# Patient Record
Sex: Female | Born: 1935 | Race: White | Hispanic: No | Smoking: Never smoker
Health system: Southern US, Community
[De-identification: ages and names within clinical notes are randomized; demographics above are authoritative.]

## PROBLEM LIST (undated history)

## (undated) DIAGNOSIS — F0280 Dementia in other diseases classified elsewhere without behavioral disturbance: Secondary | ICD-10-CM

## (undated) DIAGNOSIS — C50919 Malignant neoplasm of unspecified site of unspecified female breast: Secondary | ICD-10-CM

## (undated) DIAGNOSIS — I4891 Unspecified atrial fibrillation: Secondary | ICD-10-CM

## (undated) DIAGNOSIS — I2510 Atherosclerotic heart disease of native coronary artery without angina pectoris: Secondary | ICD-10-CM

## (undated) DIAGNOSIS — I10 Essential (primary) hypertension: Secondary | ICD-10-CM

## (undated) DIAGNOSIS — N200 Calculus of kidney: Secondary | ICD-10-CM

## (undated) DIAGNOSIS — R32 Unspecified urinary incontinence: Secondary | ICD-10-CM

## (undated) DIAGNOSIS — E119 Type 2 diabetes mellitus without complications: Secondary | ICD-10-CM

## (undated) DIAGNOSIS — G309 Alzheimer's disease, unspecified: Secondary | ICD-10-CM

## (undated) DIAGNOSIS — E785 Hyperlipidemia, unspecified: Secondary | ICD-10-CM

## (undated) HISTORY — PX: REPLACEMENT TOTAL KNEE: SUR1224

## (undated) HISTORY — PX: ABDOMINAL HYSTERECTOMY: SHX81

## (undated) HISTORY — DX: Malignant neoplasm of unspecified site of unspecified female breast: C50.919

## (undated) HISTORY — DX: Calculus of kidney: N20.0

## (undated) HISTORY — DX: Unspecified urinary incontinence: R32

---

## 2003-01-06 ENCOUNTER — Inpatient Hospital Stay (HOSPITAL_COMMUNITY): Admission: RE | Admit: 2003-01-06 | Discharge: 2003-01-11 | Payer: Self-pay | Admitting: Orthopaedic Surgery

## 2003-01-11 ENCOUNTER — Inpatient Hospital Stay
Admission: RE | Admit: 2003-01-11 | Discharge: 2003-01-19 | Payer: Self-pay | Admitting: Physical Medicine & Rehabilitation

## 2003-01-19 ENCOUNTER — Encounter: Admission: RE | Admit: 2003-01-19 | Discharge: 2003-01-19 | Payer: Self-pay | Admitting: Family Medicine

## 2003-08-01 ENCOUNTER — Encounter: Admission: RE | Admit: 2003-08-01 | Discharge: 2003-08-01 | Payer: Self-pay | Admitting: Internal Medicine

## 2003-11-19 ENCOUNTER — Emergency Department (HOSPITAL_COMMUNITY): Admission: EM | Admit: 2003-11-19 | Discharge: 2003-11-19 | Payer: Self-pay | Admitting: Family Medicine

## 2005-01-01 ENCOUNTER — Ambulatory Visit: Payer: Self-pay | Admitting: Internal Medicine

## 2006-07-21 ENCOUNTER — Ambulatory Visit: Payer: Self-pay | Admitting: Internal Medicine

## 2006-07-21 LAB — CONVERTED CEMR LAB
Crystals: NEGATIVE
Mucus, UA: NEGATIVE
Nitrite: NEGATIVE
Total Protein, Urine: 30 mg/dL — AB
pH: 5.5 (ref 5.0–8.0)

## 2006-08-17 ENCOUNTER — Ambulatory Visit: Payer: Self-pay | Admitting: Internal Medicine

## 2006-08-17 LAB — CONVERTED CEMR LAB
ALT: 14 units/L (ref 0–40)
Alkaline Phosphatase: 62 units/L (ref 39–117)
BUN: 24 mg/dL — ABNORMAL HIGH (ref 6–23)
Basophils Relative: 0.5 % (ref 0.0–1.0)
CO2: 31 meq/L (ref 19–32)
Calcium: 9.7 mg/dL (ref 8.4–10.5)
Eosinophils Absolute: 0.2 10*3/uL (ref 0.0–0.6)
GFR calc Af Amer: 78 mL/min
GFR calc non Af Amer: 64 mL/min
HDL: 48.5 mg/dL (ref >39.0)
Lymphocytes Relative: 28.4 % (ref 12.0–46.0)
Monocytes Relative: 7.8 % (ref 3.0–11.0)
Neutro Abs: 3.7 10*3/uL (ref 1.4–7.7)
Platelets: 232 10*3/uL (ref 150–400)
RBC: 4.54 M/uL (ref 3.87–5.11)
Total CHOL/HDL Ratio: 4.1
Triglycerides: 165 mg/dL — ABNORMAL HIGH (ref 0–149)
VLDL: 33 mg/dL (ref 0–40)

## 2007-04-20 ENCOUNTER — Encounter: Admission: RE | Admit: 2007-04-20 | Discharge: 2007-04-20 | Payer: Self-pay | Admitting: Family Medicine

## 2007-07-16 ENCOUNTER — Encounter: Admission: RE | Admit: 2007-07-16 | Discharge: 2007-07-16 | Payer: Self-pay | Admitting: Emergency Medicine

## 2008-07-03 ENCOUNTER — Encounter: Payer: Self-pay | Admitting: Cardiovascular Disease

## 2008-07-14 ENCOUNTER — Encounter: Payer: Self-pay | Admitting: Cardiovascular Disease

## 2008-12-08 ENCOUNTER — Encounter: Payer: Self-pay | Admitting: Cardiovascular Disease

## 2009-01-22 DIAGNOSIS — C50919 Malignant neoplasm of unspecified site of unspecified female breast: Secondary | ICD-10-CM

## 2009-01-22 DIAGNOSIS — E119 Type 2 diabetes mellitus without complications: Secondary | ICD-10-CM

## 2009-01-22 DIAGNOSIS — I10 Essential (primary) hypertension: Secondary | ICD-10-CM | POA: Insufficient documentation

## 2009-01-22 DIAGNOSIS — N200 Calculus of kidney: Secondary | ICD-10-CM | POA: Insufficient documentation

## 2009-01-22 DIAGNOSIS — K219 Gastro-esophageal reflux disease without esophagitis: Secondary | ICD-10-CM | POA: Insufficient documentation

## 2009-01-22 DIAGNOSIS — N390 Urinary tract infection, site not specified: Secondary | ICD-10-CM

## 2009-01-22 HISTORY — DX: Malignant neoplasm of unspecified site of unspecified female breast: C50.919

## 2009-01-22 HISTORY — DX: Calculus of kidney: N20.0

## 2009-01-23 ENCOUNTER — Encounter (INDEPENDENT_AMBULATORY_CARE_PROVIDER_SITE_OTHER): Payer: Self-pay | Admitting: *Deleted

## 2009-01-23 ENCOUNTER — Ambulatory Visit: Payer: Self-pay | Admitting: Cardiovascular Disease

## 2009-01-23 DIAGNOSIS — I4510 Unspecified right bundle-branch block: Secondary | ICD-10-CM

## 2009-01-23 DIAGNOSIS — R079 Chest pain, unspecified: Secondary | ICD-10-CM | POA: Insufficient documentation

## 2009-01-23 DIAGNOSIS — R0602 Shortness of breath: Secondary | ICD-10-CM

## 2009-01-27 IMAGING — US US ABDOMEN COMPLETE
1 series · 14 of 25 positions shown · non-contrast
Comparison: none

CLINICAL DATA: Abdominal pain.

[Series 1: us abdomen complete · 0.26mm/px · 14 of 71 slices shown]
[im 1/71]
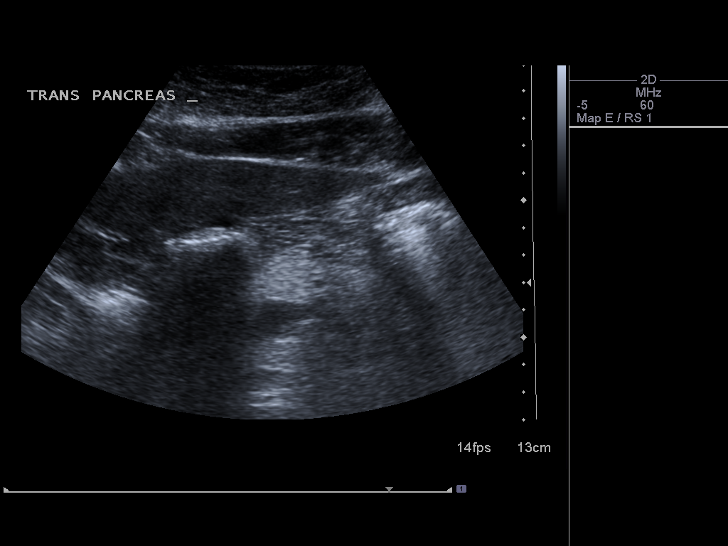
[im 6/71]
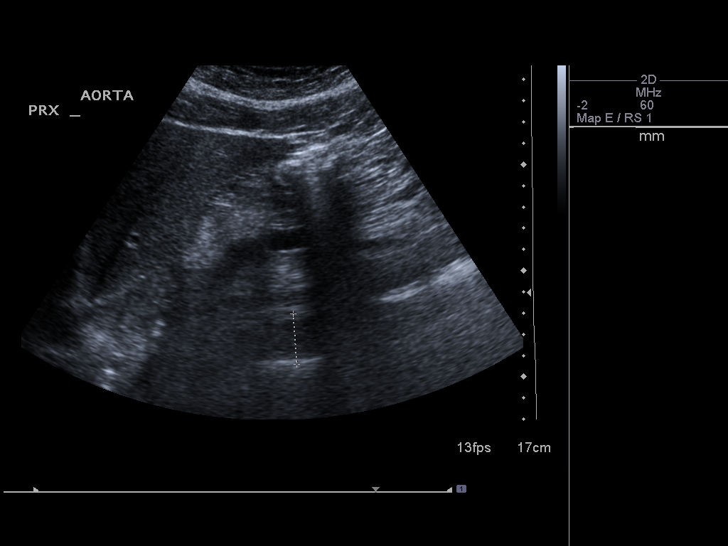
[im 12/71]
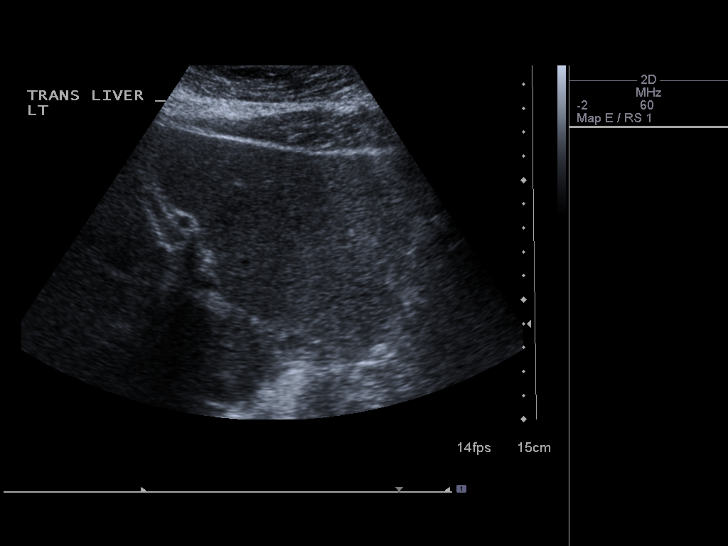
[im 18/71]
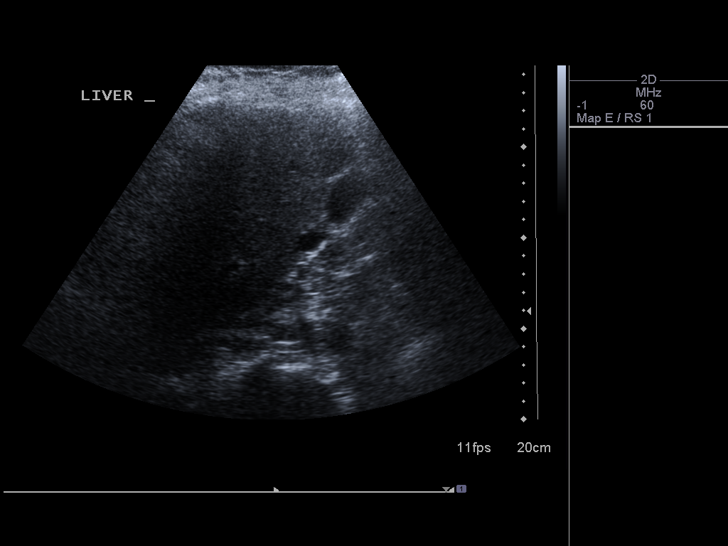
[im 24/71]
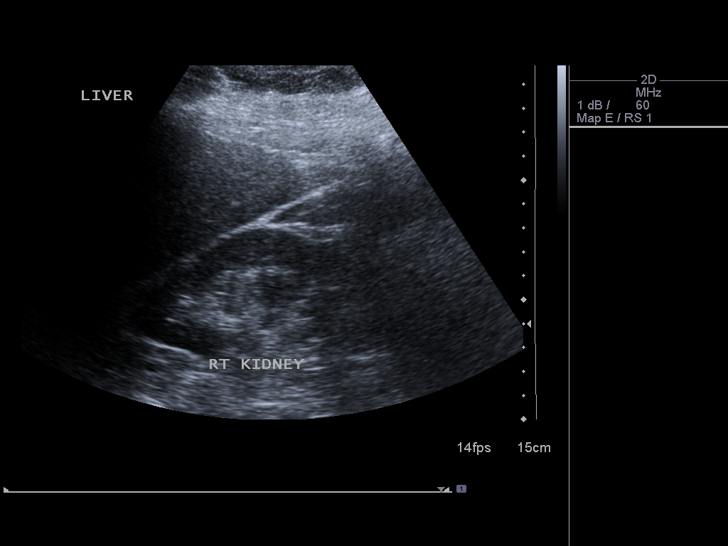
[im 27/71]
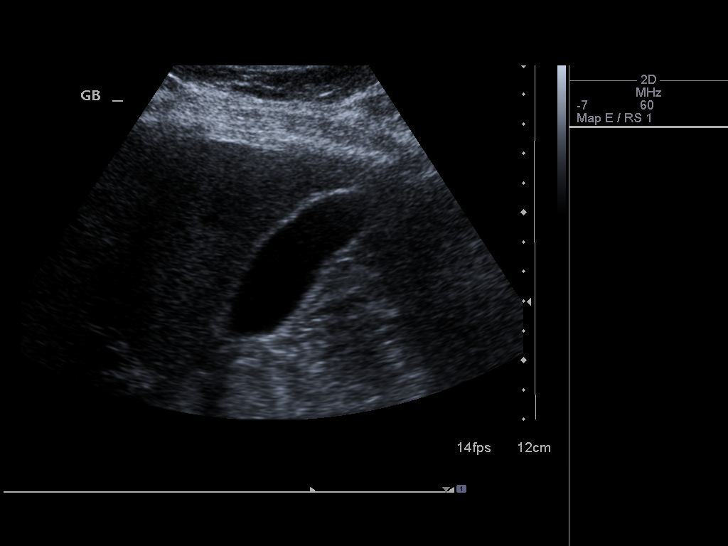
[im 33/71]
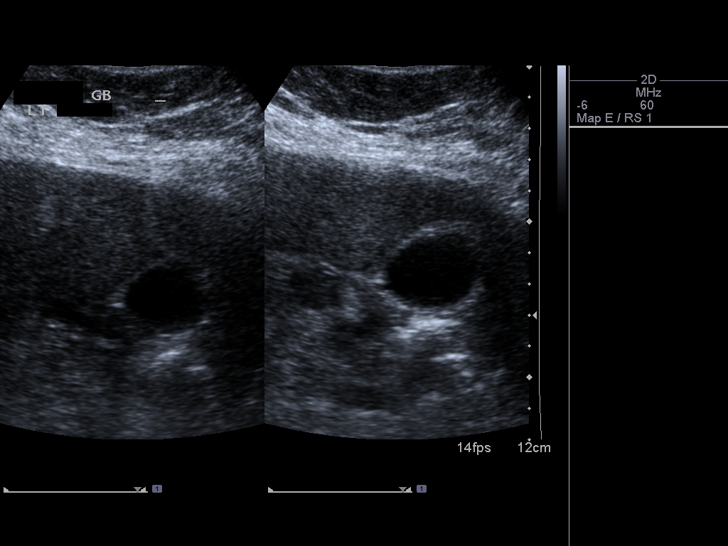
[im 38/71]
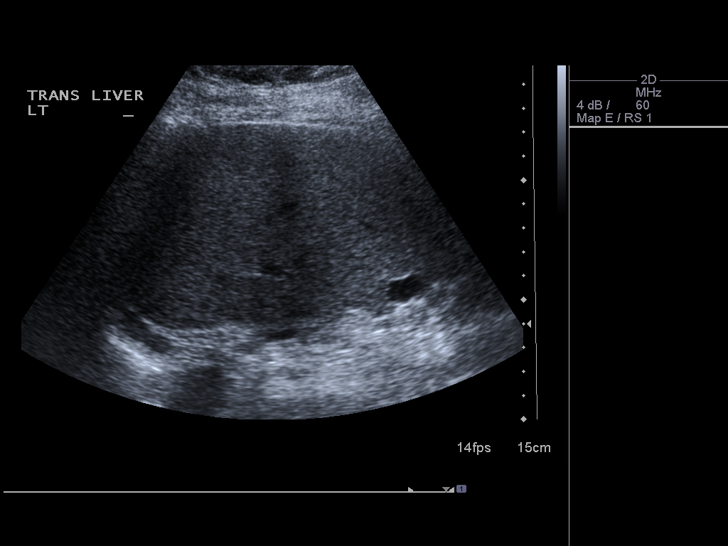
[im 44/71]
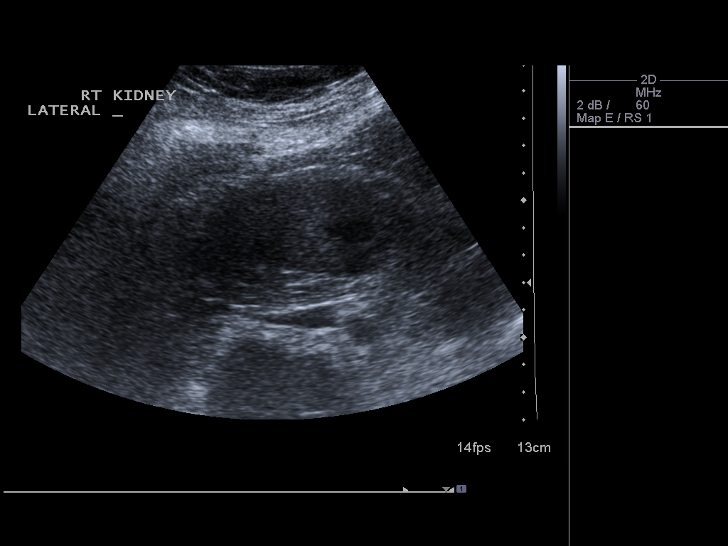
[im 47/71]
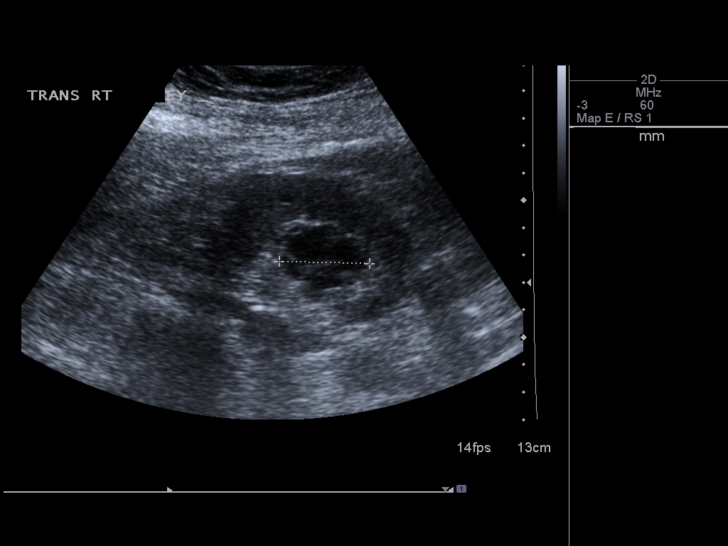
[im 53/71]
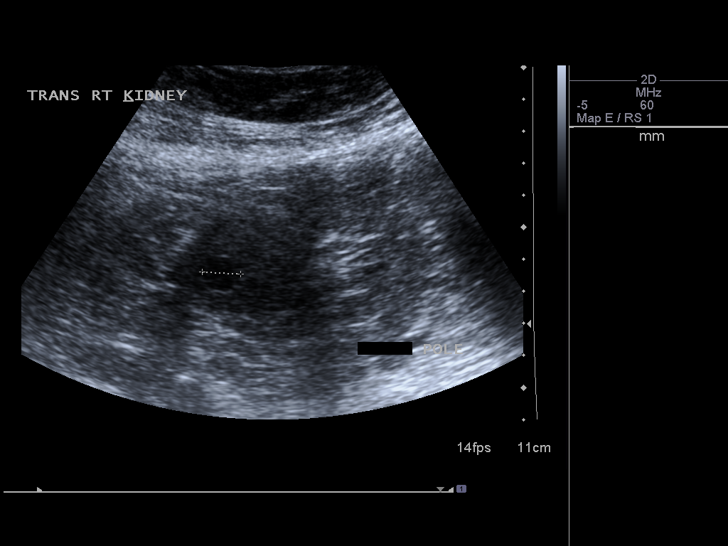
[im 59/71]
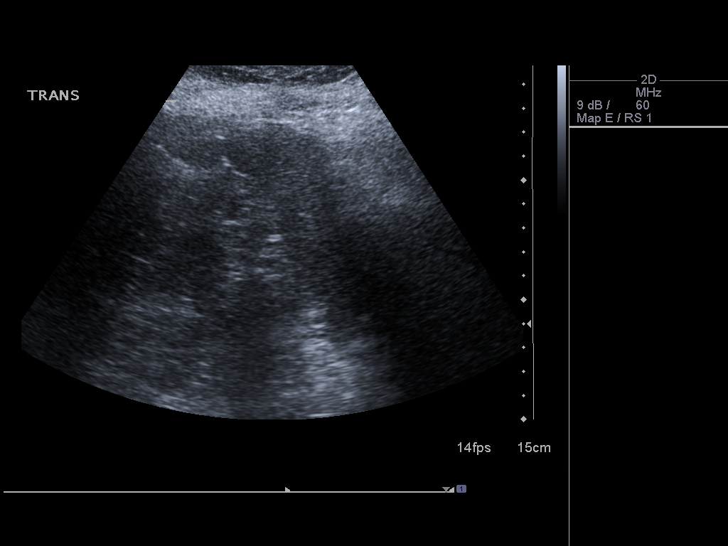
[im 65/71]
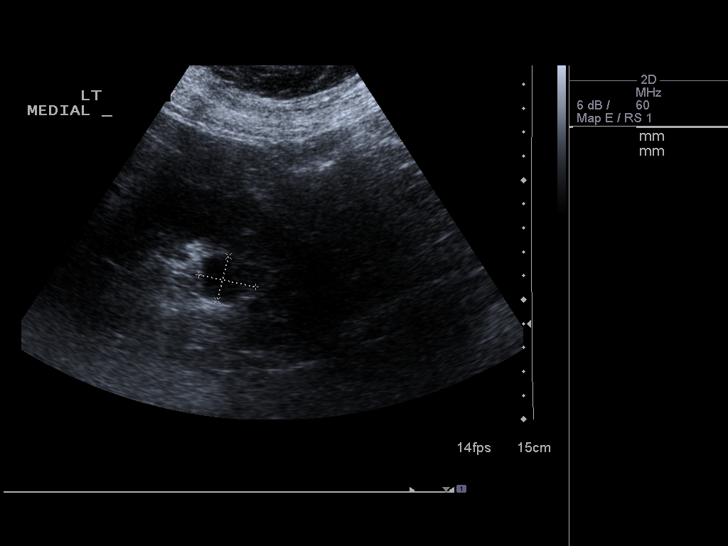
[im 71/71]
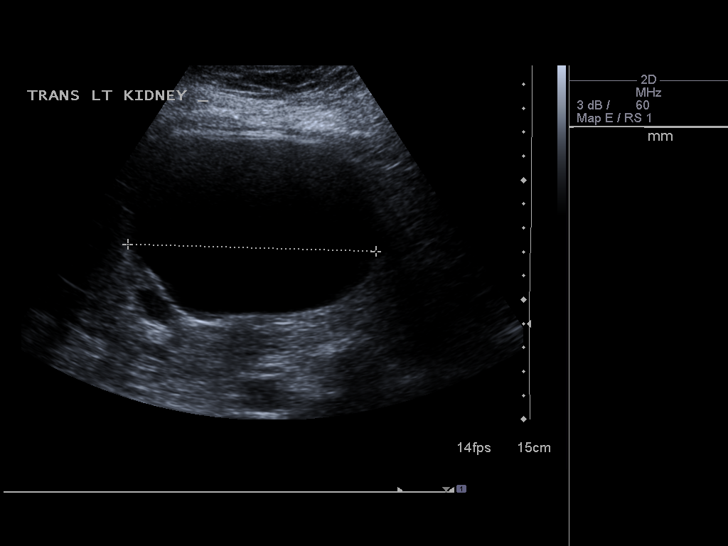

[14 of 25 positions shown; findings below may reference images not displayed]

Abdomen ultrasound:

Comparison to previous CT of 08/01/2003 and earlier studies. Visualized portions
of pancreas, abdominal aorta, IVC unremarkable, segments obscured by overlying
bowel gas. There is a 11 x 16 mm simple cyst in the  anterior right hepatic
segment. Remainder of liver parenchyma unremarkable. Gallbladder physiologically
distended without stones, wall thickening, or pericholecystic fluid. Visualized
portions of common bile duct unremarkable, normal in caliber, 3.9 mm diameter.
The right kidney measures at least 12.6 cm in length, with a septated cystic
parapelvic process in the lower pole measured 25 x 33 x 43 mm. A small separate
13 mm cyst is also noted in the lower pole. The left kidney measures at least
13.1 cm in length, with a 7.8 x 10.2 x 2.5 cm partial exophytic cyst extending
laterally. No hydronephrosis.
IMPRESSION: 1. Normal gallbladder.
2. Complex cystic right renal process, corresponding to lesions seen on recent
CT.  The lack of enlargement compared to CT of 01/07/2003 would suggest a benign
etiology.

## 2009-01-31 ENCOUNTER — Telehealth (INDEPENDENT_AMBULATORY_CARE_PROVIDER_SITE_OTHER): Payer: Self-pay | Admitting: *Deleted

## 2009-02-01 ENCOUNTER — Ambulatory Visit: Payer: Self-pay

## 2009-02-01 ENCOUNTER — Encounter: Payer: Self-pay | Admitting: Cardiovascular Disease

## 2010-04-18 ENCOUNTER — Encounter: Payer: Self-pay | Admitting: Family Medicine

## 2010-04-18 ENCOUNTER — Ambulatory Visit: Payer: Self-pay | Admitting: Family Medicine

## 2010-04-18 DIAGNOSIS — R32 Unspecified urinary incontinence: Secondary | ICD-10-CM

## 2010-04-18 DIAGNOSIS — R300 Dysuria: Secondary | ICD-10-CM | POA: Insufficient documentation

## 2010-04-18 DIAGNOSIS — H5316 Psychophysical visual disturbances: Secondary | ICD-10-CM

## 2010-04-18 DIAGNOSIS — R413 Other amnesia: Secondary | ICD-10-CM

## 2010-04-18 HISTORY — DX: Unspecified urinary incontinence: R32

## 2010-04-18 LAB — CONVERTED CEMR LAB
Albumin: 4.3 g/dL (ref 3.5–5.2)
Alkaline Phosphatase: 74 units/L (ref 39–117)
BUN: 24 mg/dL — ABNORMAL HIGH (ref 6–23)
Bilirubin Urine: NEGATIVE
Glucose, Bld: 138 mg/dL — ABNORMAL HIGH (ref 70–99)
Glucose, Urine, Semiquant: NEGATIVE
Hemoglobin: 12.9 g/dL (ref 12.0–15.0)
MCHC: 31.5 g/dL (ref 30.0–36.0)
MCV: 96.5 fL (ref 78.0–100.0)
Protein, U semiquant: NEGATIVE
RBC: 4.25 M/uL (ref 3.87–5.11)
Total Bilirubin: 0.6 mg/dL (ref 0.3–1.2)
pH: 5.5

## 2010-04-29 ENCOUNTER — Encounter: Payer: Self-pay | Admitting: Family Medicine

## 2010-05-10 ENCOUNTER — Encounter: Payer: Self-pay | Admitting: Family Medicine

## 2010-07-14 ENCOUNTER — Encounter: Payer: Self-pay | Admitting: *Deleted

## 2010-07-23 NOTE — Assessment & Plan Note (Signed)
Summary: NP/KH   Vital Signs:  Patient profile:   77 year old female Height:      60 inches Weight:      242.8 pounds BMI:     47.59 Pulse rate:   72 / minute BP sitting:   126 / 61  (right arm)  Vitals Entered By: Arlyss Repress CMA, (April 18, 2010 9:00 AM) CC: new pt. establish care with new pcp. c/o dysurian and frequency x several weeks. Is Patient Diabetic? Yes Pain Assessment Patient in pain? no        CC:  new pt. establish care with new pcp. c/o dysurian and frequency x several weeks.Marland Kitchen  History of Present Illness: 77 y/o F with pmhx of CAD, DM, HTN, HLD is here to establish care. She is acccompanied by her daughter, Jory Sims.  Daugh works for [employer] and was referred here by Dr Lavada Mesi.  The history was given by both the patient and her daughter.   Pt resides at [facility], which is an independent residence.  Pt has her own apt there.  She lives on the 2nd floor and only takes the elevator.  She can walk with the use of her rolling walker, but she has knee pain (s/p knee arthroplasty), which limits her walking.  She can bathe herself and prepare her own meal.  She does not take care of her finances any longer.    Dysuria:  Pt complains of LLQ pain with urination.  She has a history of UTI, and per daughter gets UTI every other month. She is on chronic macrobid for prophylaxis.  Denies fever, chills, abd pain, back pain, urinary retention.    For the past 2-3 months Daugher is noticing that pt has been more "nervous" and paranoid about her checks being deposited.  Her checks are directly deposited but pt forgets this. She keeps asking her daughter to take her to the bank to deposit her checks.  She also says that her daughter is not coming by to visit with her, even though she comes by everyday. She is becoming more forgetful.    Over the past week or so patient has been complaining to daughter that she sees someone outside her front door, looking  into her apartment.  She describes an incident where the complex had to fix her door.  They left a gaping hole at the bottom of the door.  Since then she has been seeing people looking into her apt.  She has not heard these people speak.  At one time she went to the door and stuck a knife through the opening/hole.  Her intention was to "poke their eye out."  Patient and her daughter have spoken to management about this and they insist that there is noone looking into her apt.     *** Preventative MMG: 2011 Tetanus: Dr Marcos Eke    ***Was a pt of Dr Marcos Eke, at [facility]  Habits & Providers  Alcohol-Tobacco-Diet     Tobacco Status: current     Tobacco Counseling: to quit use of tobacco products     Other Tobacco snuff  Current Medications (verified): 1)  Atenolol 25 Mg Tabs (Atenolol) .... Take One Tablet By Mouth Daily 2)  Glucotrol 10 Mg Tabs (Glipizide) .Marland Kitchen.. 1 Tab By Mouth Once Daily 3)  Aspirin 81 Mg Tbec (Aspirin) .Marland Kitchen.. 1 Tab By Mouth Daily 4)  Simvastatin 40 Mg Tabs (Simvastatin) .... Take One Tablet By Mouth Daily At Bedtime 5)  Omeprazole 20 Mg Tbec (Omeprazole) .... Take 1 Tablet By Mouth Once A Day \\par  6)  Oxybutynin Chloride 5 Mg Tabs (Oxybutynin Chloride) 7)  Nitrofurantoin Macrocrystal 100 Mg Caps (Nitrofurantoin Macrocrystal) .Marland Kitchen.. 1 Tab By Mouth Daily 8)  Diclofenac Sodium 75 Mg Tbec (Diclofenac Sodium) .Marland Kitchen.. 1 Tab By Mouth Two Times A Day 9)  Centrum Silver  Tabs (Multiple Vitamins-Minerals) .Marland Kitchen.. 1 Tab By Mouth Daily 10)  Fish Oil 1000 Mg Caps (Omega-3 Fatty Acids) .... Two Times A Day  Allergies (verified): 1)  ! Sulfa 2)  ! Penicillin  Past History:  Family History: Last updated: 04/18/2010 Breast cancer: mother, sister Diabetes: parents CVA: parents  Social History: Last updated: 04/18/2010 Widow.  Resides in [place], an independent living facility.  She has an apt on the 2nd floor.  She takes the elevator.  Education: 8th grade  Etoh:  none Tobacoo: Snuff x 77 yrs Drugs: none  Risk Factors: Smoking Status: current (04/18/2010)  Past Medical History: Current Problems:  HYPERTENSION (ICD-401.9) GERD (ICD-530.81) DIABETES MELLITUS (ICD-250.00) UTI (ICD-599.0) RENAL CALCULUS (ICD-592.0) BREAST CANCER (ICD-174.9) Urinary incontinence  History of Breast Lump, benign (L) History of nephrolithiasis CAD with neg Myoview  01/2009  Past Surgical History: Status post right total knee arthroplasty 06 January 2003. Lumpectomy, 2000.  Hysterectomy. Tonsillectomy. Excision of skin cancers. R Cataract removal   Family History: Breast cancer: mother, sister Diabetes: parents CVA: parents  Social History: Widow.  Resides in [place], an independent living facility.  She has an apt on the 2nd floor.  She takes the elevator.  Education: 8th grade  Etoh: none Tobacoo: Snuff x 77 yrs Drugs: noneSmoking Status:  current  Review of Systems General:  Denies chills, fever, weakness, and weight loss. Eyes:  Denies blurring, discharge, eye irritation, eye pain, and itching. ENT:  Complains of decreased hearing; denies difficulty swallowing, nasal congestion, and ringing in ears. CV:  Denies chest pain or discomfort, fainting, near fainting, and palpitations. Resp:  Denies chest discomfort, chest pain with inspiration, cough, and sputum productive. GI:  Denies constipation, diarrhea, nausea, and vomiting. GU:  Complains of dysuria, incontinence, and urinary frequency; denies abnormal vaginal bleeding. MS:  Complains of joint pain and mid back pain.  Physical Exam  General:  Well-developed,well-nourished,in no acute distress; alert,appropriate and cooperative throughout examination. Vitals reviewed.  Lungs:  Normal respiratory effort, chest expands symmetrically. Lungs are clear to auscultation, no crackles or wheezes.  Heart:  Distant heart sounds. S1 and S2 normal without gallop, murmur, click, rub or other extra  sounds. Msk:  Get up to and go: normal  Pt able to raise both arms and place them behind her ears with minimal pain. No palpable areas of tenderness.   Seated straight leg raise: no tenderness.  Pt not able to keep leg raised against resistance.    Extremities:  No edema  Neurologic:  alert & oriented X3.   Pt cannot recall what she made for dinner last night.  She tells a story of cooking multiple dishes, one of which is ribs.  Her daughter denies that pt made this last night.   No rigidity Gait nonshuffling   Impression & Recommendations:  Problem # 1:  DYSURIA (ICD-788.1) Assessment New Clean catch UA was negative for nitrites/leuk, +trace intact blood, 1 bacteria, 0-2 RBC.  Likely not UTI.  Will continue on chronic nitrofurantoin for now.  There was talc powder seen in UA.  Pt may be getting powder in vaginal area and this may be causing  irritation when she voids.  Pt has history of DM and yeast infections in skin folds.  Advised d/c use of talc powder and using otc clotrimazole cream instead.    Her updated medication list for this problem includes:    Nitrofurantoin Macrocrystal 100 Mg Caps (Nitrofurantoin macrocrystal) .Marland Kitchen... 1 tab by mouth daily  Orders: Urinalysis-[facility] (00000) Comp Met-[facility] (33295-18841) CBC-[facility] (66063) [facility]- Est  Level 4 (01601)  Problem # 2:  PSYCHOPHYSICAL VISUAL DISTURBANCES (ICD-368.16) Assessment: New  Per daughter's report and pt's story she is having visual hallucinations.  She is very adement in her story telling that there is someone looking through the hole in her door.  Pt is on Oxybutynin, an anticholinergic that may be causing these hallucinations.  We will try her off of this medication for a few weeks and re-evaluate.    Orders: [facility]- Est  Level 4 (09323)  Problem # 3:  MEMORY LOSS (ICD-780.93) Assessment: New  Daughter has noticed more "forgetfullness" in the last few months (2-3).  Pt is taking an anticholinergic currently (oxybutynin).   We would like this to stop at this time.  Would like to see pt in a couple of weeks to assess her memory.  She is endorsing visual hallucinations, which is concerning for Lewy body dementia.  Exam did not show shuffling gait or rigidity that would be concerning for Parkinson's.    Orders: [facility]- Est  Level 4 (55732)  Problem # 4:  DIABETES MELLITUS (ICD-250.00) Assessment: Unchanged Will check A1C.  Pt's last A1C in our system was from 2008.  Pt only on glucotrol 10mg  daily.  Given her age, goal A1C is <8, but if she is well controlled on glucotrol and taking it without problems, then will continue on this dose.    The following medications were removed from the medication list:    Glucophage 500 Mg Tabs (Metformin hcl) .Marland Kitchen... 1 tab by mouth once daily Her updated medication list for this problem includes:    Glucotrol 10 Mg Tabs (Glipizide) .Marland Kitchen... 1 tab by mouth once daily    Aspirin 81 Mg Tbec (Aspirin) .Marland Kitchen... 1 tab by mouth daily  Orders: Comp Met-[facility] (20254-27062) TSH-[facility] (37628-31517) A1C-[facility] (61607)  Complete Medication List: 1)  Atenolol 25 Mg Tabs (Atenolol) .... Take one tablet by mouth daily 2)  Glucotrol 10 Mg Tabs (Glipizide) .Marland Kitchen.. 1 tab by mouth once daily 3)  Aspirin 81 Mg Tbec (Aspirin) .Marland Kitchen.. 1 tab by mouth daily 4)  Simvastatin 40 Mg Tabs (Simvastatin) .... Take one tablet by mouth daily at bedtime 5)  Omeprazole 20 Mg Tbec (Omeprazole) .... Take 1 tablet by mouth once a day \\par  6)  Oxybutynin Chloride 5 Mg Tabs (Oxybutynin chloride) 7)  Nitrofurantoin Macrocrystal 100 Mg Caps (Nitrofurantoin macrocrystal) .Marland Kitchen.. 1 tab by mouth daily 8)  Diclofenac Sodium 75 Mg Tbec (Diclofenac sodium) .Marland Kitchen.. 1 tab by mouth two times a day 9)  Centrum Silver Tabs (Multiple vitamins-minerals) .Marland Kitchen.. 1 tab by mouth daily 10)  Fish Oil 1000 Mg Caps (Omega-3 fatty acids) .... Two times a day  Patient Instructions: 1)  Please schedule a follow-up appointment in [facility]. 2)  It was  very nice meeting today. 3)  Please look taking oxybutynin.   4)      Orders Added: 1)  Urinalysis-[facility] [00000] 2)  Comp Met-[facility] [80053-22900] 3)  CBC-[facility] [85027] 4)  TSH-[facility] [37106-26948] 5)  A1C-[facility] [83036] 6)  [facility]- Est  Level 4 [54627]   Immunization History:  Influenza Immunization History:    Influenza:  historical (03/27/2010)   Immunization History:  Influenza Immunization History:    Influenza:  Historical (03/27/2010)  Laboratory Results   Urine Tests  Date/Time Received: April 18, 2010 9:48 AM  Date/Time Reported: April 18, 2010 10:10 AM   Routine Urinalysis   Color: yellow Appearance: Clear Glucose: negative   (Normal Range: Negative) Bilirubin: negative   (Normal Range: Negative) Ketone: negative   (Normal Range: Negative) Spec. Gravity: 1.020   (Normal Range: 1.003-1.035) Blood: trace-intact   (Normal Range: Negative) pH: 5.5   (Normal Range: 5.0-8.0) Protein: negative   (Normal Range: Negative) Urobilinogen: 0.2   (Normal Range: 0-1) Nitrite: negative   (Normal Range: Negative) Leukocyte Esterace: negative   (Normal Range: Negative)  Urine Microscopic WBC/HPF: rare RBC/HPF: 0-2 Bacteria/HPF: 1+ Epithelial/HPF: 5-10 Crystals/HPF: mod talc crystals    Comments: ...........test performed by...........Marland KitchenTerese Door, CMA   Blood Tests   Date/Time Received: April 18, 2010 10:40 AM  Date/Time Reported: April 18, 2010 11:10 AM   HGBA1C: 6.3%   (Normal Range: Non-Diabetic - 3-6%   Control Diabetic - 6-8%)  Comments: ...............test performed by......Marland KitchenBonnie A. [place]az[place], MLS (ASCP)cm      Prevention & Chronic Care Immunizations   Influenza vaccine: Historical  (03/27/2010)    Tetanus booster: Not documented    Pneumococcal vaccine: Not documented    H. zoster vaccine: Not documented  Colorectal Screening   Hemoccult: Not documented    Colonoscopy: Not documented  Other Screening   Pap smear: Not documented   Pap  smear action/deferral: Not indicated-other  (04/18/2010)    Mammogram: Not documented    DXA bone density scan: Not documented   Smoking status: current  (04/18/2010)  Diabetes Mellitus   HgbA1C: 6.3  (04/18/2010)    Eye exam: Not documented    Foot exam: Not documented   High risk foot: Not documented   Foot care education: Not documented    Urine microalbumin/creatinine ratio: Not documented  Lipids   Total Cholesterol: 198  (08/17/2006)   LDL: 117  (08/17/2006)   LDL Direct: Not documented   HDL: 48.5  (08/17/2006)   Triglycerides: 165  (08/17/2006)  Hypertension   Last Blood Pressure: 126 / 61  (04/18/2010)   Serum creatinine: 0.9  (08/17/2006)   Serum potassium 4.0  (08/17/2006) CMP ordered   Self-Management Support :    Diabetes self-management support: Not documented    Hypertension self-management support: Not documented    Appended Document: NP/KH    Physical Exam  Additional Exam:  GERIATRIC FUNCTION SCREEN Mental status: registration +3, recall +3 Hearing problem: positive, was told she needs a hearing aid, but pt refuses due to cost Vision: R cataract, wears glasses, can watch TV and get around home with her glasses Home environment: Cannot walk up/down stairs, but does not need to because her apt complex has elevator.  NO falls. ADL-IADL: Can get out of bed, dresses self, prepares meals.  Does Not shop. Social support: daughters (3).  Her apt has an emergency call line Nutrition: no weight loss, +wt gain Urinary incontinence: positive, wear Depends for bed Depression: situational; 1 yr anniversary of daughter's death during Thanksgiving Arm: can touch arms to back of head.  Can pick up pen. Leg: can rise from chair (get up and to), but cannot walk very far due to painful knee Health self report: Poor   Appended Document: NP/KH I evaluated Mrs Arnall and discussed her care with Dr T.

## 2010-07-23 NOTE — Miscellaneous (Signed)
Summary: Records from [facility]  Clinical Lists Changes     Received records from [facility].  Review of records showed just office visit notes. NO LABS, NO Vaccination records.  I will not be scanning or adding any additional records from [facility].

## 2010-07-23 NOTE — Miscellaneous (Signed)
Summary: ROI  ROI   Imported By: Bradly Bienenstock 05/10/2010 11:53:37  _____________________________________________________________________  External Attachment:    Type:   Image     Comment:   External Document

## 2010-11-08 NOTE — Op Note (Signed)
NAMEMALILLANY, KAZLAUSKAS NO.:  [id number]   MEDICAL RECORD NO.:  [id number]                   PATIENT TYPE:  INP   LOCATION:  2899                                 FACILITY:  [facility]   PHYSICIAN:  Mark C. Ophelia Charter, M.D.                 DATE OF BIRTH:  1924-09-07   DATE OF PROCEDURE:  01/06/2003  DATE OF DISCHARGE:                                 OPERATIVE REPORT   PREOPERATIVE DIAGNOSIS:  Right knee osteoarthritis.   POSTOPERATIVE DIAGNOSIS:  Right knee osteoarthritis.   PROCEDURE:  Right total knee arthroplasty.   SURGEON:  Mark C. Ophelia Charter, M.D.   ASSISTANT:  Genene Churn. Denton Meek.   ANESTHESIA:  GOT, postop femoral nerve block.   OPERATIVE PROCEDURE:  After induction of general anesthesia, orotracheal  intubation, a proximal thigh tourniquet with application of preoperative  antibiotics given, which was Ancef, the leg was prepped with Duraprep, the  usual extremity sheets, drapes, split U-sheets, impervious stockinette,  Coban, sterile skin marker, and Betadine Vi-Drape application was applied.  The leg was wrapped with an Esmarch prior to tourniquet inflation.  A  midline incision was made, superficial retinaculum was reflected, and a  median parapatellar incision was performed, splitting the quad tendon  between the medial one-third and lateral two-thirds.   The patella was cut first using the oscillating saw, going from facet to  facet.  There was grade 4 chondromalacia with erosive changes in the  patella.  Intramedullary hole was made in the femur, rod placed up, and the  distal cut was made based on the #5 sizing.  Intramedullary AP cut was made,  intramedullary rod was placed on the tibia, and guide was inserted and cut  was made on the tibia, removing 10 mm of bone.  Next chamfer cuts were made  on the femur, notch cut.  A #5 size was appropriate, although the die just  brushed the anterior cortex, so it was pinned at 7 and cut at 5.  Some  posterior spurs were removed off the femur using a three-quarter curved  osteotome.  There were severe grade 4 changes in the medial compartment.  The lateral compartment was relatively spared with grade 3 and some small  areas of grade 4 changes.  The lateral meniscus showed minimal changes.  The  medial meniscus had been debrided and was degenerative.  ACL and PCL were  sacrificed.  Box cut was used in the femur, and then tibial rotation was set  using trials, flexing and extending the knee.  Rotation was marked with the  Bovie on the anterior cortex of the tibial bone and a keel cut were made,  punching for a cemented #5, which was the appropriate size.  Holes were  drilled in the patella, which was also #5.  Trial sizers were used and a 12  gave the appropriate excellent stability.  The  patient had a lateral  collateral ligament injury from an acute fall when she was in [place], and  this had been treated with a knee immobilizer for six weeks preoperatively,  and the lateral collateral ligament showed scar tissue present in the  midportion of the lateral collateral ligament, and there was chronic change  of synovitis inside the knee with blood deposits that were present from the  ligamentous injury.  Partial synovectomy was performed.  Patellar tracking  was normal, and no lateral release was necessary.  Pulsatile lavage was used  to freshen the bone.  It was meticulously dried.  Cement was vacuum-mixed  and then impacted, placing on both the prosthesis and the bone, tibial  cemented first, followed by the femur, then the tibial tray was popped in  with meticulous drying.  The patella was placed and had excellent fit down  on bone after the three little pegs had been shortened slightly due to the  patient's very small bone size and thin patella.  All excessive cement was  removed.  Cement was hardened 15 minutes.  Repeat irrigation was performed.  There was excellent patellar tracking and  good stability in both flexion and  extension.  She had a few millimeters of opening on the medial and lateral  joint line with the knee in both slight flexion as well as 90 degrees  flexion.  The tourniquet was deflated, small bleeders controlled with the  electrocautery.  Tycron 0 then placed in the quad tendon and deep  retinaculum.  Vicryl sutures were placed in the superficial retinaculum,  subcutaneous tissues, skin staple closure, Marcaine infiltration of the  skin, some in the knee joint, a postop dressing with a knee immobilizer, and  a femoral nerve block.  Instrument count and needle count were correct.  The  patient was transferred to the recovery room in stable condition.                                               Mark C. Ophelia Charter, M.D.    MCY/MEDQ  D:  01/06/2003  T:  01/07/2003  Job:  [id number]

## 2010-11-08 NOTE — Discharge Summary (Signed)
Debbie Rodriguez, ROSANDER                            ACCOUNT NO.:  [id number]   MEDICAL RECORD NO.:  [id number]                   PATIENT TYPE:  INP   LOCATION:  5040                                 FACILITY:  [facility]   PHYSICIAN:  Mark C. Ophelia Charter, M.D.                 DATE OF BIRTH:  1924-07-18   DATE OF ADMISSION:  01/06/2003  DATE OF DISCHARGE:  01/11/2003                                 DISCHARGE SUMMARY   FINAL DIAGNOSES:  1. Status post right total knee arthroplasty 06 January 2003.  2. Hypertension not otherwise specified.  3. Urinary tract infection not otherwise specified.  4. Non-insulin-dependent diabetes mellitus.  5. Long-term use of anticoagulants.   HISTORY OF PRESENT ILLNESS:  A 77 year old white female, with a long history  of right knee osteoarthritis and chronic pain, presented for preop  evaluation of her right total knee arthroplasty.  She had progressively  worsening symptoms with no response to conservative treatment. Significant  decrease in her daily activities due to the ongoing complaint.  Preop x-rays  show tricompartmental degenerative changes and marginal osteophyte  formation.  MRI scan again showed tricompartmental degenerative changes with  partial ACL tear and degenerative meniscus.   LABORATORY DATA BEFORE ADMISSION:  WBC 8.2, RBC 4.79, hemoglobin 14.9,  hematocrit 43.6.  PT 13.6, INR 1.1, PTT 27.  Sodium 138, potassium 4.7,  chloride 105, CO2 29, glucose 257, BUN 13, creatinine 1.0, calcium 9.5,  total protein 6.3, albumin 3.1, AST 27, ALT 22, alkaline phosphatase 114,  total bilirubin 0.5.  UA showed trace hemoglobin, positive nitrites, and  small leukocyte esterase, 11 to 20 white blood cells, 250 glucose.   HOSPITAL COURSE:  On January 06, 2003, the patient was taken to the operating  room at [facility], and a right total knee arthroplasty procedure  was performed.  Surgeon: Veverly Fells. Ophelia Charter, M.D.  Assistant: Genene Churn. Barry Dienes,  P.A.-C.  Anesthesia:  General with femoral nerve block.  The patient was  given local Marcaine 13.5 ml postoperatively.  Estimated blood loss: Less  than 400 ml.  There were no surgical or anesthesia complications, and the  patient was transferred to recovery in stable condition.   After surgical procedure, medicine consult was called due to patient's  increased blood glucose levels.  On January 07, 2003, vital signs stable,  afebrile.  Hemoglobin 12.6.  CBG 290.  Sliding scale changed to insulin-  resistant protocol.   On January 08, 2003, vital signs stable, afebrile.  Hemoglobin 11.6.  Hemoglobin A1C 9.1.  IV and PCA discontinued.  CBG 197, and Lantus was  increased to 20 units q.a.m.  INR 1.1.   On January 09, 2003, good pain control.  Temperature 98.6, blood pressure  146/86, pulse 101, respirations 18.  Uric acid 4.9.  PT 14.2, hemoglobin  11.6.  Sodium 134, potassium 3.6, chloride 99, CO2 28, BUN 15, creatinine  0.8, glucose 141.  Staples intact.  Wound healing nicely.   On January 10, 2003, good pain control.  Vital signs stable, afebrile.  PT  15.4, INR 1.3.  Hemoglobin 11.4.  Serum glucose 131.  CBGs ranged 150 to  200.  The patient had been started on Coumadin for DVT prophylaxis the day  after surgery.  Patient evaluated by diabetes coordinator.   On January 11, 2003, good pain control.  The patient was slow with ambulation.  CPM 0 to 75 degrees.  Vital signs stable, afebrile.  CBG 170.  PT 14.7, INR  1.2.  Hemoglobin 11.5.  Serum glucose 144.  Urine culture negative.  The  patient was doing well and ready for transfer to [facility].   DISPOSITION:  Discharged to [facility].   DISCHARGE MEDICATIONS:  The patient will remain on all current medications.   CONDITION ON DISCHARGE:  Good and stable.   DISCHARGE INSTRUCTIONS:  1. The patient will continue to work with physical therapy to improve     ambulation, range of motion, and strengthening.  2. She will be weightbearing as tolerated with walker.  3. Remain on  Coumadin for DVT prophylaxis x 3 to 4 weeks postoperatively.  4. Staples to be removed two weeks postop.  5. Dressing changes p.r.n.  6. She will follow up in our office one to two weeks after discharge from     [facility].  If there are any problems or complications before that time, we     can be notified at [phone].      Genene Churn. Denton Meek.                      Mark C. Ophelia Charter, M.D.    JMO/MEDQ  D:  02/13/2003  T:  02/13/2003  Job:  [id number]

## 2010-11-08 NOTE — Discharge Summary (Signed)
Debbie Rodriguez, Debbie Rodriguez NO.:  [id number]   MEDICAL RECORD NO.:  [id number]                   PATIENT TYPE:  ORB   LOCATION:                                       FACILITY:  [facility]   PHYSICIAN:  Erick Colace, M.D.           DATE OF BIRTH:  1925/05/21   DATE OF ADMISSION:  01/11/2003  DATE OF DISCHARGE:  01/19/2003                                 DISCHARGE SUMMARY   DISCHARGE DIAGNOSES:  1. Right total knee replacement.  2. Diabetes mellitus type 2, new diagnosis.  3. Hypertension.  4. Gastroesophageal reflux disease.   HISTORY AND PHYSICAL:  Debbie Rodriguez is a 77 year old female with history of  hypertension, breast cancer,  right knee osteoarthritis who elected to  undergo right total knee replacement January 06, 2003, by Dr. Ophelia Charter.  Postoperatively, she is partial weightbearing and on Coumadin for DVT  prophylaxis.  She was noted to have elevated blood sugars and hemoglobin A1C  was checked and noted to be high at 9.1.  CT of the abdomen and pelvis was  done secondary to recent weight loss and this showed no metastatic disease,  decreased fatty infiltration of liver.  Primary care has been following for  medical issues.  On physical therapy initially, the patient is +2 total  assist, 80% for transfers, minimal to guard assist to ambulate 12 feet with  standard walker and with impulsivity noted.   ALLERGIES:  1. SULFA.  2. PENICILLIN.  3. IODINE.   PAST MEDICAL HISTORY:  1. Hypertension.  2. GERD.  3. Left breast cancer, status post lumpectomy.  4. Left knee scope.  5. Hysterectomy.  6. Tonsillectomy.  7. Renal calculi.  8. Excision of skin cancers.   SOCIAL HISTORY:  The patient has been living with daughter and was  wheelchair bound six days prior to admission.  She lives in a one level home  with a ramp at entry.  She does not use any tobacco or alcohol.   HOSPITAL COURSE:  Debbie Rodriguez was admitted to [facility] on January 11, 2003, for  therapies to consist of PT and OT daily.  Past admission, she was maintained  on partial weightbearing status right lower extremity with CPM being used  for passive range of motion.  Coumadin was continued with Lovenox until INR  at therapeutic basis.  Her INR levels had been slow to rise.   Labs done past admission have revealed hemoglobin 11.6, hematocrit 33.8,  white count 6.6, platelets 208.  Sodium 138, potassium 3.5, chloride 101,  CO2 29, BUN 14, creatinine 0.9, glucose 101.  LFTs within normal limits  except for total protein of 5.1, albumin 2.3.  UA/UCS was done on January 11, 2003, and January 14, 2003, both showing multi-species.  The patient's knee  incision has healed well without any signs or symptoms of infection or  drainage.  No erythema noted.  She is noted to have some edema at incision  site.  Staples were discontinued and area steri-stripped on postoperative  day #12 without difficulty.  The patient's pain control has been reasonable.  She does continue with weak quads requiring immobilizers when ambulated.  Knee flexion is currently limited to 58 degrees.   During her stay in [facility], Debbie Rodriguez progressed to being supervision for  transfers, supervision for ambulating 65 feet x2 in a controlled  environment.  She is minimal assist for car transfers.  The patient requires  set up and supervision for upper body care, minimal assist for lower body  care.  A 24-hour supervision is recommended past discharge and family is  unable to provide this, therefore, search for SNF was initiated.  Bed is  available at [facility] on January 19, 2003, and the patient is to be discharged to  this facility with progressive PT and OT to continue past discharge.  CPM is  to continue for passive range of motion to help assist and improve the  patient's range of motion.   Of note, the patient's blood sugars have been monitored on a.c. and h.s.  basis and have shown good control.  Her Glucotrol was  changed to Glucotrol  XL and increased to 10 mg b.i.d.  Blood sugars ranging from 140s to  occasional high in 170s.  The patient has been taken off Lantus insulin as  of January 18, 2003, and Glucophage 500 mg p.o. q.a.m. has been added.  Recommendations are to continue monitoring blood sugars at a.c. and h.s.  basis and to continue titrating Glucophage for tighter blood sugar control.   The patient's Coumadin is therapeutic on the day of discharge and the  patient is to be discharged on 12.5 mg Coumadin.  Next protime is to be  checked on January 22, 2003, with Coumadin dose to be adjusted as needed.   DISCHARGE MEDICATIONS:  1. Tenormin 25 mg per day.  2. Prilosec 20 mg a day.  3. MiraLax 17 g a day.  4. Coumadin 12.5 mg a day.  5. Multivitamin one a day.  6. Glucotrol XL 10 mg b.i.d.  7. Ditropan 2.5 mg q.h.s.  8. Glucophage 500 mg q.a.m.  9. Oxycodone IR 5 to 10 mg q.4-6h. p.r.n. pain.  10.      Skelaxin 400 mg p.o. q.i.d. p.r.n. spasms.   ACTIVITY:  Continue partial weightbearing on right lower extremity with use  of walker.   DIET:  Carb modified medium.   DISCHARGE INSTRUCTIONS:  Progressive PT and OT to continue past discharge.  Continue monitoring blood sugars at a.c. and h.s. basis.  Continue titrating  Glucophage for tighter blood sugar control.  The next protime is to be  checked on January 22, 2003, and Coumadin to continue through February 06, 2003,  to continue DVT prophylaxis close follow-up.    FOLLOW UP:  The patient is to follow up with Dr. Ophelia Charter for postoperative  check, to follow up with local M.D. for diabetic monitoring, to follow up  with Dr. Wynn Banker as needed.      Greg Cutter, P.A.                    Erick Colace, M.D.    PP/MEDQ  D:  01/19/2003  T:  01/19/2003  Job:  [id number]   cc:   Rollene Rotunda, M.D.   Veverly Fells. Ophelia Charter, M.D.  [address]  [place], [place] [address]  Fax: [phone]

## 2012-07-12 ENCOUNTER — Emergency Department (HOSPITAL_COMMUNITY): Payer: Medicare Other

## 2012-07-12 ENCOUNTER — Encounter (HOSPITAL_COMMUNITY): Payer: Self-pay | Admitting: Family Medicine

## 2012-07-12 ENCOUNTER — Observation Stay (HOSPITAL_COMMUNITY)
Admission: EM | Admit: 2012-07-12 | Discharge: 2012-07-15 | Disposition: A | Discharge status: Skilled Nursing Facility | Payer: Medicare Other | Attending: Family Medicine | Admitting: Family Medicine

## 2012-07-12 DIAGNOSIS — R269 Unspecified abnormalities of gait and mobility: Secondary | ICD-10-CM

## 2012-07-12 DIAGNOSIS — R4182 Altered mental status, unspecified: Secondary | ICD-10-CM

## 2012-07-12 DIAGNOSIS — Z79899 Other long term (current) drug therapy: Secondary | ICD-10-CM | POA: Insufficient documentation

## 2012-07-12 DIAGNOSIS — R630 Anorexia: Secondary | ICD-10-CM | POA: Insufficient documentation

## 2012-07-12 DIAGNOSIS — M47812 Spondylosis without myelopathy or radiculopathy, cervical region: Secondary | ICD-10-CM | POA: Insufficient documentation

## 2012-07-12 DIAGNOSIS — R443 Hallucinations, unspecified: Secondary | ICD-10-CM | POA: Insufficient documentation

## 2012-07-12 DIAGNOSIS — I10 Essential (primary) hypertension: Secondary | ICD-10-CM | POA: Insufficient documentation

## 2012-07-12 DIAGNOSIS — I4891 Unspecified atrial fibrillation: Secondary | ICD-10-CM | POA: Insufficient documentation

## 2012-07-12 DIAGNOSIS — F0280 Dementia in other diseases classified elsewhere without behavioral disturbance: Principal | ICD-10-CM | POA: Insufficient documentation

## 2012-07-12 DIAGNOSIS — F0390 Unspecified dementia without behavioral disturbance: Secondary | ICD-10-CM

## 2012-07-12 DIAGNOSIS — I4510 Unspecified right bundle-branch block: Secondary | ICD-10-CM | POA: Insufficient documentation

## 2012-07-12 DIAGNOSIS — R5383 Other fatigue: Secondary | ICD-10-CM

## 2012-07-12 DIAGNOSIS — Z7902 Long term (current) use of antithrombotics/antiplatelets: Secondary | ICD-10-CM | POA: Insufficient documentation

## 2012-07-12 DIAGNOSIS — R42 Dizziness and giddiness: Secondary | ICD-10-CM | POA: Insufficient documentation

## 2012-07-12 DIAGNOSIS — G309 Alzheimer's disease, unspecified: Principal | ICD-10-CM | POA: Insufficient documentation

## 2012-07-12 DIAGNOSIS — M159 Polyosteoarthritis, unspecified: Secondary | ICD-10-CM

## 2012-07-12 DIAGNOSIS — R262 Difficulty in walking, not elsewhere classified: Secondary | ICD-10-CM | POA: Insufficient documentation

## 2012-07-12 DIAGNOSIS — I2510 Atherosclerotic heart disease of native coronary artery without angina pectoris: Secondary | ICD-10-CM | POA: Insufficient documentation

## 2012-07-12 DIAGNOSIS — E119 Type 2 diabetes mellitus without complications: Secondary | ICD-10-CM | POA: Insufficient documentation

## 2012-07-12 DIAGNOSIS — R5381 Other malaise: Secondary | ICD-10-CM

## 2012-07-12 DIAGNOSIS — M79609 Pain in unspecified limb: Secondary | ICD-10-CM | POA: Insufficient documentation

## 2012-07-12 HISTORY — DX: Dementia in other diseases classified elsewhere, unspecified severity, without behavioral disturbance, psychotic disturbance, mood disturbance, and anxiety: F02.80

## 2012-07-12 HISTORY — DX: Type 2 diabetes mellitus without complications: E11.9

## 2012-07-12 HISTORY — DX: Malignant neoplasm of unspecified site of unspecified female breast: C50.919

## 2012-07-12 HISTORY — DX: Essential (primary) hypertension: I10

## 2012-07-12 HISTORY — DX: Hyperlipidemia, unspecified: E78.5

## 2012-07-12 HISTORY — DX: Unspecified atrial fibrillation: I48.91

## 2012-07-12 HISTORY — DX: Alzheimer's disease, unspecified: G30.9

## 2012-07-12 HISTORY — DX: Atherosclerotic heart disease of native coronary artery without angina pectoris: I25.10

## 2012-07-12 LAB — CBC WITH DIFFERENTIAL/PLATELET
Basophils Absolute: 0 10*3/uL (ref 0.0–0.1)
Eosinophils Relative: 3 % (ref 0–5)
HCT: 47.1 % — ABNORMAL HIGH (ref 36.0–46.0)
Hemoglobin: 15.4 g/dL — ABNORMAL HIGH (ref 12.0–15.0)
Lymphocytes Relative: 15 % (ref 12–46)
Lymphs Abs: 0.9 10*3/uL (ref 0.7–4.0)
MCV: 95.5 fL (ref 78.0–100.0)
Monocytes Absolute: 0.5 10*3/uL (ref 0.1–1.0)
Monocytes Relative: 8 % (ref 3–12)
RDW: 12.8 % (ref 11.5–15.5)
WBC: 6.4 10*3/uL (ref 4.0–10.5)

## 2012-07-12 LAB — COMPREHENSIVE METABOLIC PANEL
BUN: 22 mg/dL (ref 6–23)
CO2: 26 mEq/L (ref 19–32)
Calcium: 10.3 mg/dL (ref 8.4–10.5)
Creatinine, Ser: 0.91 mg/dL (ref 0.50–1.10)
GFR calc Af Amer: 65 mL/min — ABNORMAL LOW (ref >90)
GFR calc non Af Amer: 56 mL/min — ABNORMAL LOW (ref >90)
Glucose, Bld: 141 mg/dL — ABNORMAL HIGH (ref 70–99)

## 2012-07-12 MED ORDER — OMEGA-3-ACID ETHYL ESTERS 1 G PO CAPS
1.0000 g | ORAL_CAPSULE | Freq: Every day | ORAL | Status: DC
  Administered 2012-07-13 – 2012-07-15 (×3): 1 g via ORAL
  Filled 2012-07-12 (×3): qty 1

## 2012-07-12 MED ORDER — INSULIN ASPART 100 UNIT/ML ~~LOC~~ SOLN: 0.0000 [IU] | Freq: Every day | SUBCUTANEOUS | Status: DC

## 2012-07-12 MED ORDER — LORAZEPAM 0.5 MG PO TABS
0.5000 mg | ORAL_TABLET | Freq: Every day | ORAL | Status: DC
  Administered 2012-07-12 – 2012-07-14 (×3): 0.5 mg via ORAL
  Filled 2012-07-12 (×3): qty 1

## 2012-07-12 MED ORDER — ACETAMINOPHEN 650 MG RE SUPP: 650.0000 mg | Freq: Four times a day (QID) | RECTAL | Status: DC | PRN

## 2012-07-12 MED ORDER — INSULIN ASPART 100 UNIT/ML ~~LOC~~ SOLN
0.0000 [IU] | Freq: Three times a day (TID) | SUBCUTANEOUS | Status: DC
  Administered 2012-07-13: 5 [IU] via SUBCUTANEOUS
  Administered 2012-07-14: 2 [IU] via SUBCUTANEOUS
  Administered 2012-07-14: 1 [IU] via SUBCUTANEOUS
  Administered 2012-07-14: 2 [IU] via SUBCUTANEOUS
  Administered 2012-07-15: 3 [IU] via SUBCUTANEOUS
  Administered 2012-07-15: 2 [IU] via SUBCUTANEOUS

## 2012-07-12 MED ORDER — GLIPIZIDE 10 MG PO TABS: 10.0000 mg | ORAL_TABLET | Freq: Every day | ORAL | Status: DC

## 2012-07-12 MED ORDER — ACETAMINOPHEN 325 MG PO TABS: 650.0000 mg | ORAL_TABLET | Freq: Four times a day (QID) | ORAL | Status: DC | PRN

## 2012-07-12 MED ORDER — DONEPEZIL HCL 10 MG PO TABS
10.0000 mg | ORAL_TABLET | Freq: Every day | ORAL | Status: DC
  Administered 2012-07-13 – 2012-07-15 (×3): 10 mg via ORAL
  Filled 2012-07-12 (×3): qty 1

## 2012-07-12 MED ORDER — LORAZEPAM 1 MG PO TABS: 0.5000 mg | ORAL_TABLET | Freq: Two times a day (BID) | ORAL | Status: DC

## 2012-07-12 MED ORDER — DICLOFENAC SODIUM 75 MG PO TBEC: 75.0000 mg | DELAYED_RELEASE_TABLET | Freq: Two times a day (BID) | ORAL | Status: DC | PRN

## 2012-07-12 MED ORDER — MEMANTINE HCL 10 MG PO TABS
10.0000 mg | ORAL_TABLET | Freq: Two times a day (BID) | ORAL | Status: DC
  Administered 2012-07-12 – 2012-07-15 (×6): 10 mg via ORAL
  Filled 2012-07-12 (×8): qty 1

## 2012-07-12 MED ORDER — PAROXETINE HCL 20 MG PO TABS
40.0000 mg | ORAL_TABLET | Freq: Every day | ORAL | Status: DC
  Administered 2012-07-12 – 2012-07-15 (×4): 40 mg via ORAL
  Filled 2012-07-12 (×4): qty 2

## 2012-07-12 MED ORDER — POLYETHYLENE GLYCOL 3350 17 G PO PACK
17.0000 g | PACK | Freq: Every day | ORAL | Status: DC | PRN
  Filled 2012-07-12: qty 1

## 2012-07-12 MED ORDER — ASPIRIN 81 MG PO TBEC
81.0000 mg | DELAYED_RELEASE_TABLET | Freq: Every day | ORAL | Status: DC
Start: 2012-07-12 — End: 2012-07-12

## 2012-07-12 MED ORDER — ATENOLOL 25 MG PO TABS
25.0000 mg | ORAL_TABLET | Freq: Every day | ORAL | Status: DC
  Administered 2012-07-12 – 2012-07-15 (×4): 25 mg via ORAL
  Filled 2012-07-12 (×4): qty 1

## 2012-07-12 MED ORDER — ASPIRIN EC 81 MG PO TBEC
81.0000 mg | DELAYED_RELEASE_TABLET | Freq: Every day | ORAL | Status: DC
  Administered 2012-07-12 – 2012-07-15 (×4): 81 mg via ORAL
  Filled 2012-07-12 (×4): qty 1

## 2012-07-12 MED ORDER — ENOXAPARIN SODIUM 40 MG/0.4ML ~~LOC~~ SOLN
40.0000 mg | SUBCUTANEOUS | Status: DC
  Administered 2012-07-14: 40 mg via SUBCUTANEOUS
  Filled 2012-07-12 (×4): qty 0.4

## 2012-07-12 MED ORDER — PANTOPRAZOLE SODIUM 40 MG PO TBEC
40.0000 mg | DELAYED_RELEASE_TABLET | Freq: Every day | ORAL | Status: DC
  Administered 2012-07-12 – 2012-07-15 (×4): 40 mg via ORAL
  Filled 2012-07-12 (×2): qty 1

## 2012-07-12 MED ORDER — SIMVASTATIN 40 MG PO TABS
40.0000 mg | ORAL_TABLET | Freq: Every day | ORAL | Status: DC
  Administered 2012-07-12 – 2012-07-14 (×3): 40 mg via ORAL
  Filled 2012-07-12 (×4): qty 1

## 2012-07-12 MED ORDER — SODIUM CHLORIDE 0.9 % IV BOLUS (SEPSIS)
500.0000 mL | Freq: Once | INTRAVENOUS | Status: AC
  Administered 2012-07-12: 500 mL via INTRAVENOUS

## 2012-07-12 NOTE — Progress Notes (Signed)
Late entry for 1930. Pt admitted to unit 6700, room 6708. Pt alert and oriented to self. Pt denies any pain. Pt placed on telemetry, box 6708. Pt oriented to unit. Bed in lowest position. Bed alarm on. Call bell within reach. Will continue to monitor.

## 2012-07-12 NOTE — ED Notes (Signed)
Admitting MD at bedside.

## 2012-07-12 NOTE — Progress Notes (Signed)
Unable to complete admission history due to pt being poor historian and no family present.

## 2012-07-12 NOTE — ED Notes (Addendum)
Pt incontinent. Sheets changed. Pt cleaned. Pt tolerated well. Unable to obtain urine sample.

## 2012-07-12 NOTE — ED Provider Notes (Signed)
History     CSN: [id number]  Arrival date & time 07/12/12  1258   First MD Initiated Contact with Patient 07/12/12 1317      Chief Complaint  Patient presents with  . Fatigue    (Consider location/radiation/quality/duration/timing/severity/associated sxs/prior treatment) HPI Comments: Patient with a history of Alzheimers presents today with her daughter from home where she lives with her daughter.  Her daughter reports that the patient has been increasingly confused and more agitated over the past couple weeks.  She had a fall one week ago and hit her head on a dresser.  She did not lose consciousness.  Daughter reports that she called EMS, but the patient refused to go to the hospital.  Since that time the patient has been complaining of dizziness.  Over the past couple of days the daughter reports that the patient has been refusing to eat and had drank very little fluid.  The patient is not on any blood thinners.  Daughter also reports that the patient has been complaining of knee pain and her knees have been giving out when she attempts to ambulate.  Unable to obtain any history from the patient.  Patient has a history of DM, Hyperlipidemia, Dementia, and frequent UTI's.  She is currently on Macrobid daily to prevent UTI's.  Daughter reports that the patient is incontinent of urine.  She has not noticed any odor to her urine.  When patient asked if she is having pain she denies.  Daughter reports that the patient has not had any fevers, cough, nausea, or vomiting.    History provided by: Daughter.    No past medical history on file.  No past surgical history on file.  No family history on file.  History  Substance Use Topics  . Smoking status: Not on file  . Smokeless tobacco: Not on file  . Alcohol Use: Not on file    OB History    No data available      Review of Systems  Unable to perform ROS: Dementia    Allergies  Penicillins and Sulfonamide derivatives  Home  Medications   Current Outpatient Rx  Name  Route  Sig  Dispense  Refill  . ACETAMINOPHEN 500 MG PO TABS   Oral   Take 500 mg by mouth every 4 (four) hours as needed. For pain         . ASPIRIN 81 MG PO TBEC   Oral   Take 81 mg by mouth daily.           . ATENOLOL 25 MG PO TABS   Oral   Take 25 mg by mouth daily.           Marland Kitchen DICLOFENAC SODIUM 75 MG PO TBEC   Oral   Take 75 mg by mouth 2 (two) times daily.           Marland Kitchen DICLOFENAC SODIUM 75 MG PO TBEC   Oral   Take 75 mg by mouth 2 (two) times daily as needed. For pain         . DONEPEZIL HCL 10 MG PO TABS   Oral   Take 10 mg by mouth daily.         Marland Kitchen GLIPIZIDE 10 MG PO TABS   Oral   Take 10 mg by mouth daily.           Marland Kitchen LORAZEPAM 0.5 MG PO TABS   Oral   Take 0.5 mg by mouth 2 (two)  times daily.         Marland Kitchen MEMANTINE HCL 10 MG PO TABS   Oral   Take 10 mg by mouth 2 (two) times daily.         . CENTRUM SILVER PO   Oral   Take 1 tablet by mouth daily.           Marland Kitchen NITROFURANTOIN MACROCRYSTAL 100 MG PO CAPS   Oral   Take 100 mg by mouth daily.           Marland Kitchen FISH OIL 1200 MG PO CAPS   Oral   Take 1,200 mg by mouth daily.          Marland Kitchen OMEPRAZOLE 20 MG PO CPDR   Oral   Take 20 mg by mouth daily.           Marland Kitchen PAROXETINE HCL 40 MG PO TABS   Oral   Take 40 mg by mouth daily.         Marland Kitchen SIMVASTATIN 40 MG PO TABS   Oral   Take 40 mg by mouth at bedtime.           . TOLTERODINE TARTRATE 2 MG PO TABS   Oral   Take 2 mg by mouth 2 (two) times daily.           BP 136/66  Pulse 69  Temp 98.8 F (37.1 C) (Oral)  Resp 16  SpO2 96%  Physical Exam  Nursing note and vitals reviewed. Constitutional: She appears well-developed.  HENT:  Mouth/Throat: Oropharynx is clear and moist.       Forehead hematoma Left periorbital bruising  Eyes: Pupils are equal, round, and reactive to light.  Neck: Normal range of motion. Neck supple.  Cardiovascular: Normal rate, regular rhythm and normal  heart sounds.   Pulmonary/Chest: Effort normal and breath sounds normal. No respiratory distress. She has no wheezes. She has no rales. She exhibits no tenderness.  Musculoskeletal: Normal range of motion.       Bruising of both anterior knees  Neurological:       Alert to person, but not alert to place or time Patient unable to cooperate with neurological exam  Skin: Skin is warm and dry. No rash noted.  Psychiatric: She has a normal mood and affect.    ED Course  Procedures (including critical care time)  Labs Reviewed  CBC WITH DIFFERENTIAL - Abnormal; Notable for the following:    Hemoglobin 15.4 (*)     HCT 47.1 (*)     All other components within normal limits  COMPREHENSIVE METABOLIC PANEL - Abnormal; Notable for the following:    Glucose, Bld 141 (*)     GFR calc non Af Amer 56 (*)     GFR calc Af Amer 65 (*)     All other components within normal limits  URINALYSIS, ROUTINE W REFLEX MICROSCOPIC   Dg Chest 2 View  07/12/2012  *RADIOLOGY REPORT*  Clinical Data: Confusion and delirium.  CHEST - 2 VIEW  Comparison: 11/19/2003.  Findings: Trachea is midline.  Heart size is accentuated by AP technique and low lung volumes.  Thoracic aorta is calcified. Lungs are clear.  No pleural fluid.  Old right rib fractures.  IMPRESSION: Low lung volumes.  No acute findings.   Original Report Authenticated By: Leanna Battles, M.D.    Dg Knee 2 Views Left  07/12/2012  *RADIOLOGY REPORT*  Clinical Data: Left knee pain, bruising, and decreased range of motion.  LEFT KNEE - 1-2 VIEW  Comparison: None.  Findings: No evidence of acute fracture or dislocation.  No definite knee joint effusion is seen on the lateral projection, which is mildly rotated.  Medial compartment degenerative spurring is noted, without significant joint space narrowing.  Mild chondrocalcinosis also demonstrated.  IMPRESSION:  1.  No acute findings. 2.  Medial compartment degenerative spurring. 3.  Mild chondrocalcinosis.    Original Report Authenticated By: Myles Rosenthal, M.D.    Dg Knee 2 Views Right  07/12/2012  *RADIOLOGY REPORT*  Clinical Data: Knee pain.  Previous knee replacement.  RIGHT KNEE - 1-2 VIEW  Comparison: None.  Findings: No evidence of fracture or dislocation.  Total knee arthroplasty is seen.  No evidence of prosthetic loosening.  No other significant bone abnormality identified.  IMPRESSION: Prior total knee arthroplasty.  No acute findings or other significant abnormality identified.   Original Report Authenticated By: Myles Rosenthal, M.D.    Ct Head Wo Contrast  07/12/2012  *RADIOLOGY REPORT*  Clinical Data:  Fall, altered mental status.  CT HEAD WITHOUT CONTRAST CT CERVICAL SPINE WITHOUT CONTRAST  Technique:  Multidetector CT imaging of the head and cervical spine was performed following the standard protocol without intravenous contrast.  Multiplanar CT image reconstructions of the cervical spine were also generated.  Comparison:   None.  CT HEAD  Findings: No evidence of acute infarct, acute hemorrhage, mass lesion, mass effect or hydrocephalus.  Mild atrophy. Periventricular low attenuation.  Scattered mucosal thickening and polypoid mucosal disease in the paranasal sinuses.  No air fluid levels.  Mastoid air cells are clear.  No fracture.  IMPRESSION:  1.  No acute intracranial abnormality. 2.  Mild atrophy and chronic microvascular white matter ischemic changes.  CT CERVICAL SPINE  Findings: Image quality is somewhat degraded by motion.  No definite fracture or subluxation.  Degenerative change is seen at C1-2.  Mild endplate degenerative changes in the mid cervical spine with associated facet hypertrophy.  Neural foramina appear grossly patent.  Visualized lung apices show no acute findings.  Soft tissues are unremarkable.  IMPRESSION:  1.  No acute fracture or subluxation. 2.  Mild spondylosis.   Original Report Authenticated By: Leanna Battles, M.D.    Ct Cervical Spine Wo Contrast  07/12/2012   *RADIOLOGY REPORT*  Clinical Data:  Fall, altered mental status.  CT HEAD WITHOUT CONTRAST CT CERVICAL SPINE WITHOUT CONTRAST  Technique:  Multidetector CT imaging of the head and cervical spine was performed following the standard protocol without intravenous contrast.  Multiplanar CT image reconstructions of the cervical spine were also generated.  Comparison:   None.  CT HEAD  Findings: No evidence of acute infarct, acute hemorrhage, mass lesion, mass effect or hydrocephalus.  Mild atrophy. Periventricular low attenuation.  Scattered mucosal thickening and polypoid mucosal disease in the paranasal sinuses.  No air fluid levels.  Mastoid air cells are clear.  No fracture.  IMPRESSION:  1.  No acute intracranial abnormality. 2.  Mild atrophy and chronic microvascular white matter ischemic changes.  CT CERVICAL SPINE  Findings: Image quality is somewhat degraded by motion.  No definite fracture or subluxation.  Degenerative change is seen at C1-2.  Mild endplate degenerative changes in the mid cervical spine with associated facet hypertrophy.  Neural foramina appear grossly patent.  Visualized lung apices show no acute findings.  Soft tissues are unremarkable.  IMPRESSION:  1.  No acute fracture or subluxation. 2.  Mild spondylosis.   Original Report Authenticated By: Juliette Alcide  Fredirick Lathe, M.D.      No diagnosis found.   Date: 07/12/2012  Rate: 79  Rhythm: atrial fibrillation  QRS Axis: normal  Intervals: a fib  ST/T Wave abnormalities: normal  Conduction Disutrbances:none  Narrative Interpretation:   Old EKG Reviewed: changes noted    MDM  Patient presenting with inability to ambulate, AMS, and decreased PO intake.  Patient has a history of Alzheimer's and currently lives at home with her daughter.  CT head and neck negative.  CXR negative.  Labs unremarkable.  UA negative.  However, due to the patient's difficulty ambulating and decrease PO intake will admit patient for further management and work up.   Patient discussed with Family Practice service who has agreed to admit the patient.          Pascal Lux [place], PA-C 07/13/12 1457

## 2012-07-12 NOTE — ED Notes (Addendum)
Per PT[facility]: Pt from home where she lives with family. Pt reports generalized weakness x 4 days that is progressively worsening. Pt unable to ambulate. Pt incontinent.  Pt denies eating/drinking x 2 days. Hx dementia but per family pt is at baseline.  PCP MD Heil instructed family to bring pt here for admission. 140/90. 86 NSR. 97% RA. 117 CBG. Denies any pain. Pt presents with hematoma to left eye. Pt states that "a few days ago I fell and laid on the ground for a few hours. But I never saw anyone for it."

## 2012-07-12 NOTE — H&P (Signed)
I examined this patient and discussed the care plan with Dr Ermalinda Memos and the [facility] team and agree with assessment and plan as documented in the admission note above. I agree with his plan to rule out UTI and DVT of right leg.

## 2012-07-12 NOTE — H&P (Signed)
Family Medicine Teaching [facility] Admission History and Physical Service Pager: [phone]  Patient name: Debbie Rodriguez Medical record number: [id number] Date of birth: 1924-11-14 Age: 79 y.o. Gender: female  Primary Care Provider: Ellin Mayhew, MD  Chief Complaint: Sleepiness, decreased PO intake and hallucinations  Assessment and Plan: Debbie Rodriguez is a 79 y.o. year old female with PMH of CAD s/p catheterization, DM2, HTN, Afib, and alzheimer's disease presenting with confusion, sleepiness, and agitation after a fall 1 week ago. It's not immediately clear as to what the inciting event is for her recent change. Her Head CT did not show any acute changes and with her PMH of alzheimer's it's possible that her dementia is progressing or that she may have an underlying infectious process causing this delirium seen today. Also of concern are her recent hallucinations and agitation. With her conglomeration of hallucinations plus dementia lewy body dementia is of concern but less likely as she does not have any parkinsonian features.   Altered mental status and falls - Possibly progression of alzheimer's dementia vs infectious encephalopathy vs delerium from benzodiazepine or other medication use vs psychiatric disorder - Cath UA, UCx and gram stain and treat as necessary - check Vit D level and replace if needed - PT eval and treat and home safety eval - half dose benzodiazepine and hold nitrofurantoin - Consider psychiatric evaluation if we cannot find an etiology  Dizziness - consistent with orthostatic hypotension from her Hx - obtain orthostatic VS - half dose benzodiazepine and hold nitrofurantoin - Monitor BP for need of antihypertensives.   Hallucinations/agitation - Possibly component of dementia picture and possibly due to psychogenic etiology - No Hx of psychiatric d/o, although she is on paxil - Plan to place sitter if needed and low dose IV haldol for agitation not  helped by redirection.   Leg pain - With Hx of recent significant bed bound state and BL tenderness - Venous duplex BL to r/o DVT  DM2 - A1C - Hold home glipizide, SSI  CAD s/p catheterizations - Continue ASA, BB, and statin  Afib - Continue BB, monitor on tele  FEN/GI: carb modified, nutrition consult, SLIV Prophylaxis: Lovenox Disposition: Tele, home vs SNF after work up  Code Status: Unclear, will clarify with family.   History of Present Illness: Debbie Rodriguez is a 79 y.o. year old female presenting with a fall 1 week ago and increased sleepiness and altered mental status over that last week. She lives at home with her daughter and has refused to get out of bed for the last 3 days. Earlier in the week she refused to get out of bed for another week. She has been complaining of visual hallucinations for the last few months, described as sometimes people stabbing under the door as well as seeing people in the room that aren't there.  She's been having a hard time walking due to weakness, and says that when she fell her legs just gave out and she hit her head on a dresser. She did not lose consciousness, EMS was called and did not take her to the ED when she refused (she answered correctly to orientation questions). She has been incontinent of bowel and bladder. She's c/o Chills, R sided flank/abd pain, and BL leg pain for weeks. Her daughter  States she's also had decreased PO intake for the last 1 week, previously she would eat almost anything.  She endorses confusion starting 3 days ago and dizziness when she stands or sits  up. She denies fever, n/v/d, dysuria, back pain, recent visual changes.  She has had frequent UTI's in the past and takes nitrofurantoin daily. She denies any new medications, and states that she has only missed meds lately when she (her daughter) could not get her to get out of bed to take her meds.   [facility] but plans to establish with  Dr. Sheffield Slider next month.   Patient Active Problem List  Diagnosis  . BREAST CANCER  . DIABETES MELLITUS  . PSYCHOPHYSICAL VISUAL DISTURBANCES  . HYPERTENSION  . RBBB  . GERD  . RENAL CALCULUS  . UTI  . MEMORY LOSS  . DYSPNEA  . CHEST PAIN  . DYSURIA  . URINARY INCONTINENCE   Past Medical History: No past medical history on file. Past Surgical History: No past surgical history on file. Social History: History  Substance Use Topics  . Smoking status: Not on file  . Smokeless tobacco: Not on file  . Alcohol Use: Not on file   For any additional social history documentation, please refer to relevant sections of EMR.  Family History: No family history on file. Allergies: Allergies  Allergen Reactions  . Penicillins     REACTION: Reaction not known  . Sulfonamide Derivatives    No current facility-administered medications on file prior to encounter.   Current Outpatient Prescriptions on File Prior to Encounter  Medication Sig Dispense Refill  . aspirin 81 MG EC tablet Take 81 mg by mouth daily.        Marland Kitchen atenolol (TENORMIN) 25 MG tablet Take 25 mg by mouth daily.        . diclofenac (VOLTAREN) 75 MG EC tablet Take 75 mg by mouth 2 (two) times daily.        Marland Kitchen donepezil (ARICEPT) 10 MG tablet Take 10 mg by mouth daily.      Marland Kitchen glipiZIDE (GLUCOTROL) 10 MG tablet Take 10 mg by mouth daily.        . memantine (NAMENDA) 10 MG tablet Take 10 mg by mouth 2 (two) times daily.      . Multiple Vitamins-Minerals (CENTRUM SILVER PO) Take 1 tablet by mouth daily.        . nitrofurantoin (MACRODANTIN) 100 MG capsule Take 100 mg by mouth daily.        Marland Kitchen omeprazole (PRILOSEC) 20 MG capsule Take 20 mg by mouth daily.        Marland Kitchen PARoxetine (PAXIL) 40 MG tablet Take 40 mg by mouth daily.      . simvastatin (ZOCOR) 40 MG tablet Take 40 mg by mouth at bedtime.         Review Of Systems: Per HPI , otherwise 12 point review of systems was performed and was unremarkable.  Physical Exam: BP 132/54   Pulse 66  Temp 98.8 F (37.1 C) (Oral)  Resp 20  SpO2 99% Exam: Gen: NAD, alert, cooperative with exam HEENT: NCAT, EOMI, PERRL CV: RRR, good S1/S2,  3/6 systolic murmur Resp: CTABL, no wheezes, non-labored Abd: Soft, tenderness to palpation of RLQ, BS present, no guarding  Ext: No significant edema, BL tenderness to palpation, no erythema or warmth.  Neuro: Alert and oriented X 3, Strength 5/5 and sensation in tact in all 4 extremities, No tremor, CN2-12 intact, no cog-wheeling   Labs and Imaging: CBC BMET   Lab 07/12/12 1338  WBC 6.4  HGB 15.4*  HCT 47.1*  PLT 153    Lab 07/12/12 1338  NA 139  K 4.2  CL 101  CO2 26  BUN 22  CREATININE 0.91  GLUCOSE 141*  CALCIUM 10.3    CXR 07/12/2012 IMPRESSION:  Low lung volumes. No acute findings.  CT Head 07/12/2012 IMPRESSION:  1. No acute intracranial abnormality.  2. Mild atrophy and chronic microvascular white matter ischemic  Changes.  CT cervical spine 07/12/2012 IMPRESSION:  1. No acute fracture or subluxation.  2. Mild spondylosis.  DG Knee L 07/12/2012 IMPRESSION:  1. No acute findings.  2. Medial compartment degenerative spurring.  3. Mild chondrocalcinosis.  DG R Knee 07/12/2012 IMPRESSION:  Prior total knee arthroplasty. No acute findings or other  significant abnormality identified.   Kevin Fenton, MD 07/12/2012, 4:48 PM  Teaching Service Addendum.  I have seen and evaluated this pt and agree with Dr. Felipa Emory assessment and plan documented in this H&P  D. [place] Rolene Arbour, MD Family Medicine  PGY-2

## 2012-07-12 NOTE — ED Notes (Signed)
PA Heather at bedside. 

## 2012-07-12 NOTE — ED Notes (Signed)
Pt presents with hematoma to left eye with brusing and redness. Per caretake/daughter pt fell 1 wk ago and since has had a decrease in MS. Pt incontinent, unable to take care of herself. Hx: dementia and alzheimer's. Pt reports pain but unable to localize. Pt oriented to self and place.

## 2012-07-13 ENCOUNTER — Encounter (HOSPITAL_COMMUNITY): Payer: Self-pay | Admitting: General Practice

## 2012-07-13 DIAGNOSIS — M79609 Pain in unspecified limb: Secondary | ICD-10-CM

## 2012-07-13 LAB — URINALYSIS, ROUTINE W REFLEX MICROSCOPIC
Nitrite: NEGATIVE
Protein, ur: 100 mg/dL — AB
Specific Gravity, Urine: 1.022 (ref 1.005–1.030)
Urobilinogen, UA: 0.2 mg/dL (ref 0.0–1.0)

## 2012-07-13 LAB — BASIC METABOLIC PANEL
Chloride: 101 mEq/L (ref 96–112)
GFR calc Af Amer: 72 mL/min — ABNORMAL LOW (ref >90)
Potassium: 4.2 mEq/L (ref 3.5–5.1)

## 2012-07-13 LAB — URINE MICROSCOPIC-ADD ON

## 2012-07-13 LAB — HEMOGLOBIN A1C
Hgb A1c MFr Bld: 6.5 % — ABNORMAL HIGH (ref <5.7)
Mean Plasma Glucose: 140 mg/dL — ABNORMAL HIGH (ref <117)

## 2012-07-13 LAB — CBC
HCT: 45.4 % (ref 36.0–46.0)
Hemoglobin: 14.8 g/dL (ref 12.0–15.0)
WBC: 6.3 10*3/uL (ref 4.0–10.5)

## 2012-07-13 LAB — GLUCOSE, CAPILLARY: Glucose-Capillary: 257 mg/dL — ABNORMAL HIGH (ref 70–99)

## 2012-07-13 MED ORDER — PRO-STAT SUGAR FREE PO LIQD
30.0000 mL | Freq: Two times a day (BID) | ORAL | Status: DC
  Administered 2012-07-14 – 2012-07-15 (×3): 30 mL via ORAL
  Filled 2012-07-13 (×4): qty 30

## 2012-07-13 NOTE — ED Provider Notes (Signed)
Medical screening examination/treatment/procedure(s) were conducted as a shared visit with non-physician practitioner(s) and myself.  I personally evaluated the patient during the encounter   Rolan Bucco, MD 07/13/12 1523

## 2012-07-13 NOTE — Progress Notes (Signed)
Family Medicine Teaching Service Daily Progress Note Service Page: [phone]  Subjective:  Denies confusion overnight. No complaints of new pains. Seemed clear to her daughter last night before she left at 9pm.   Objective: Temp:  [97.3 F (36.3 C)-98.8 F (37.1 C)] 97.3 F (36.3 C) (01/21 0519) Pulse Rate:  [66-98] 75  (01/21 0519) Resp:  [16-26] 18  (01/21 0519) BP: (125-136)/(54-72) 133/69 mmHg (01/21 0519) SpO2:  [96 %-100 %] 100 % (01/21 0519) Weight:  [221 lb 3.2 oz (100.336 kg)] 221 lb 3.2 oz (100.336 kg) (01/20 2147) Exam: Gen: NAD, somnolent, complains about orientation questions and tells me different name, wont answer some of th equestuions HEENT: NCAT  CV: RRR, good S1/S2, 3/6 systolic murmur  Resp: CTABL, no wheezes, non-labored  Abd: Soft, tenderness to palpation of RLQ, BS present, no guarding  Ext: No significant edema, BL tenderness to palpation, no erythema or warmth.  Neuro: Alert and oriented X place and time, states her name is minerva  - limited by somnolence  I have reviewed the patient's medications, labs, imaging, and diagnostic testing.  Notable results are summarized below.  CBC BMET   Lab 07/13/12 0450 07/12/12 1338  WBC 6.3 6.4  HGB 14.8 15.4*  HCT 45.4 47.1*  PLT 182 153    Lab 07/13/12 0450 07/12/12 1338  NA 140 139  K 4.2 4.2  CL 101 101  CO2 26 26  BUN 20 22  CREATININE 0.83 0.91  GLUCOSE 103* 141*  CALCIUM 10.3 10.3     Imaging/Diagnostic Tests: CXR 07/12/2012  IMPRESSION:  Low lung volumes. No acute findings.   CT Head 07/12/2012  IMPRESSION:  1. No acute intracranial abnormality.  2. Mild atrophy and chronic microvascular white matter ischemic  Changes.   CT cervical spine 07/12/2012  IMPRESSION:  1. No acute fracture or subluxation.  2. Mild spondylosis.   DG Knee L 07/12/2012  IMPRESSION:  1. No acute findings.  2. Medial compartment degenerative spurring.  3. Mild chondrocalcinosis.   DG R Knee 07/12/2012    IMPRESSION:  Prior total knee arthroplasty. No acute findings or other  significant abnormality identified.  BL Venous duplex 07/13/2012 Lower extremity venous duplex has been completed. Preliminary findings: Bilateral: No evidence of DVT or Baker's Cyst.  Scheduled Meds:   . aspirin EC  81 mg Oral Daily  . atenolol  25 mg Oral Daily  . donepezil  10 mg Oral Daily  . enoxaparin (LOVENOX) injection  40 mg Subcutaneous Q24H  . insulin aspart  0-5 Units Subcutaneous QHS  . insulin aspart  0-9 Units Subcutaneous TID WC  . LORazepam  0.5 mg Oral QHS  . memantine  10 mg Oral BID  . omega-3 acid ethyl esters  1 g Oral Daily  . pantoprazole  40 mg Oral Daily  . PARoxetine  40 mg Oral Daily  . simvastatin  40 mg Oral QHS   Continuous Infusions:  PRN Meds:.acetaminophen, acetaminophen, polyethylene glycol  Plan: Debbie INCLAN is a 79 y.o. year old female with PMH of CAD s/p catheterization, DM2, HTN, Afib, and alzheimer's disease presenting with confusion, sleepiness, and agitation after a fall 1 week ago.  Altered mental status and falls  - Possibly progression of alzheimer's dementia vs  delerium from benzodiazepine or other medication use vs psychiatric disorder  - No leukocytosis, UA without LE or nitrites, afebrile, no complaints - Cath UA, UCx and gram stain and treat as necessary  - check Vit D level and  replace if needed  - PT/OT eval and treat and home safety eval  - half dose benzodiazepine and hold nitrofurantoin  - Consider addition of seroquel nightly, continue namenda and aricept.   Dizziness  - consistent with orthostatic hypotension from her Hx  - obtain orthostatic vital signs - half dose benzodiazepine and hold nitrofurantoin  - Monitor BP for need of antihypertensives.   Hallucinations/agitation  - Possibly component of dementia picture and possibly due to psychogenic etiology  - No Hx of psychiatric d/o, although she is on paxil  - Plan to place sitter if  needed and low dose IV haldol for agitation not helped by redirection.  - Seroquel as above  Leg pain  - With Hx of recent significant bed bound state and BL tenderness  - Venous duplex shows no evidence of clot  DM2  - A1C  - Hold home glipizide, SSI   CAD s/p catheterizations  - Continue ASA, BB, and statin   Afib  - rate controlled - Continue BB, monitor on tele   FEN/GI: carb modified, nutrition consult, SLIV  Prophylaxis: Lovenox  Disposition: Tele, home vs SNF after work up  Code Status: DNR/DNI   Kevin Fenton, MD 07/13/2012, 9:16 AM

## 2012-07-13 NOTE — Progress Notes (Signed)
I examined this patient and discussed the care plan with Dr Ermalinda Memos and the [facility] team and agree with assessment and plan as documented in the progress note above. She was able to stand from the chair using her arms and stood briefly wide-based. Quickly sat after complains of "hip pain" indicating her sacroiliac areas.

## 2012-07-13 NOTE — Progress Notes (Signed)
Utilization review completed.  

## 2012-07-13 NOTE — Progress Notes (Signed)
Physical Therapy Evaluation Patient Details Name: Debbie Rodriguez MRN: [id number] DOB: 01-Mar-1925 Today's Date: 07/13/2012 Time: 1308-6578 PT Time Calculation (min): 17 min  PT Assessment / Plan / Recommendation Clinical Impression  79 yo female admitted with AMS, recent fall; Presents with decr funct mobility; Will benefit from PT to maximize independence and safety with mobility/transfers, and to facilitate dc home; In light of current functional level, and requirement of mod/max assist, and knowledge that pt is home alone for stretches of the day, we must consider SNF to for rehab to maximize independence and safety with mobility prior to dc back to home    PT Assessment  Patient needs continued PT services    Follow Up Recommendations  SNF    Does the patient have the potential to tolerate intense rehabilitation      Barriers to Discharge Decreased caregiver support      Equipment Recommendations  None recommended by PT    Recommendations for Other Services     Frequency Min 3X/week    Precautions / Restrictions Precautions Precautions: Fall Restrictions Weight Bearing Restrictions: No   Pertinent Vitals/Pain no apparent distress       Mobility  Bed Mobility Bed Mobility: Supine to Sit;Sitting - Scoot to Edge of Bed Supine to Sit: 3: Mod assist;With rails;HOB elevated Sitting - Scoot to Edge of Bed: 3: Mod assist Details for Bed Mobility Assistance: Cues to initiate, and for technique; Handheld assist to elevate trunk from bed; Needed bed pad to assist with reciprocal scoot to EOB Transfers Transfers: Sit to Stand;Stand to Sit Sit to Stand: 3: Mod assist;From bed;With upper extremity assist Stand to Sit: 2: Max assist;To chair/3-in-1;With armrests Details for Transfer Assistance: Better able to stand from bed (possibly less fatigued, or pt more motivated to get OOB, than stand from chair with OT); Mod assist for rise, notable Left lean with standing; cues for use of  armrests to control descent Ambulation/Gait Ambulation/Gait Assistance: 1: +2 Total assist Ambulation/Gait: Patient Percentage: 50% Ambulation Distance (Feet): 2 Feet Assistive device: Rolling walker Ambulation/Gait Assistance Details: Decreased weight shift into single limb stance Right or left, making LE advancement very difficult right or Left; extremely short, shuffling steps Gait Pattern: Shuffle    Shoulder Instructions     Exercises     PT Diagnosis: Generalized weakness;Difficulty walking  PT Problem List: Decreased strength;Decreased activity tolerance;Decreased balance;Decreased mobility;Decreased coordination;Decreased cognition;Decreased knowledge of use of DME;Decreased safety awareness;Decreased knowledge of precautions;Pain PT Treatment Interventions: DME instruction;Gait training;Functional mobility training;Therapeutic activities;Therapeutic exercise;Cognitive remediation;Patient/family education   PT Goals Acute Rehab PT Goals PT Goal Formulation: With patient Time For Goal Achievement: 07/20/12 Potential to Achieve Goals: Good Pt will go Supine/Side to Sit: with modified independence PT Goal: Supine/Side to Sit - Progress: Goal set today Pt will go Sit to Supine/Side: with modified independence PT Goal: Sit to Supine/Side - Progress: Goal set today Pt will go Sit to Stand: with supervision PT Goal: Sit to Stand - Progress: Goal set today Pt will go Stand to Sit: with supervision PT Goal: Stand to Sit - Progress: Goal set today Pt will Transfer Bed to Chair/Chair to Bed: with supervision PT Transfer Goal: Bed to Chair/Chair to Bed - Progress: Goal set today Pt will Ambulate: 51 - 150 feet;with min assist;with rolling walker PT Goal: Ambulate - Progress: Goal set today  Visit Information  Last PT Received On: 07/13/12 Assistance Needed: +2    Subjective Data  Subjective: Agreeable t OOB   Prior Functioning  Home  Living Lives With: Family Available Help at  Discharge: Family;Personal care attendant;Available PRN/intermittently Type of Home: House Home Access: Level entry Home Layout: One level Bathroom Shower/Tub: Engineer, manufacturing systems: Standard Home Adaptive Equipment: Tub transfer bench;Walker - rolling;Wheelchair - manual Additional Comments: Will need to verify home situation and equipment, not sure how relaiable a historian pt is Prior Function Level of Independence: Needs assistance Needs Assistance: Bathing Bath: Moderate Comments: Pt reports she has an aide who comes in approx 4 hour a day while daughter is at work; Pt is apparently at home at least a few hours at a time alone Communication Communication: HOH Dominant Hand: Right    Cognition  Overall Cognitive Status: Impaired Area of Impairment: Awareness of deficits Difficult to assess due to: Hard of hearing/deaf Arousal/Alertness: Awake/alert Orientation Level: Appears intact for tasks assessed Behavior During Session: [facility] for tasks performed Awareness of Deficits: Pt reports she believe she could go home today without assistance (despite 2 attempts to stand with great difficulty). Cognition - Other Comments: Slow to process questions regarding home living and PLOF.    Extremity/Trunk Assessment Right Upper Extremity Assessment RUE ROM/Strength/Tone: WFL for tasks assessed Left Upper Extremity Assessment LUE ROM/Strength/Tone: WFL for tasks assessed Right Lower Extremity Assessment RLE ROM/Strength/Tone: Deficits RLE ROM/Strength/Tone Deficits: Noted bruising knee from recent fall; Generalized weakness, requiring physical assist and UE support for successful sit to stand Left Lower Extremity Assessment LLE ROM/Strength/Tone: Deficits LLE ROM/Strength/Tone Deficits: Noted bruising knee from recent fall; Generalized weakness, requiring physical assist and UE support for successful sit to stand   Balance    End of Session PT - End of Session Equipment Utilized  During Treatment: Gait belt Activity Tolerance: Patient limited by fatigue Patient left: in chair;with chair alarm set;with call bell/phone within reach Nurse Communication: Mobility status  GP Functional Assessment Tool Used: Clinical Judgement Functional Limitation: Mobility: Walking and moving around Mobility: Walking and Moving Around Current Status [phone]): At least 40 percent but less than 60 percent impaired, limited or restricted Mobility: Walking and Moving Around Goal Status [phone]): 0 percent impaired, limited or restricted   Van Clines [facility] [place], [place] [phone]  07/13/2012, 1:06 PM

## 2012-07-13 NOTE — Clinical Social Work Psychosocial (Signed)
Clinical Social Work Department BRIEF PSYCHOSOCIAL ASSESSMENT 07/13/2012  Patient:  Debbie Rodriguez, Debbie Rodriguez     Account Number:  [id number]     Admit date:  07/12/2012  Clinical Social Worker:  Delmer Islam  Date/Time:  07/13/2012 03:33 PM  Referred by:  Physician  Date Referred:  07/13/2012 Referred for  SNF Placement   Other Referral:   Interview type:  Patient Other interview type:    PSYCHOSOCIAL DATA Living Status:  ALONE Admitted from facility:   Level of care:   Primary support name:  Wilber Bihari Primary support relationship to patient:  CHILD, ADULT Degree of support available:   Good family support  Wilber Bihari: [phone]    CURRENT CONCERNS Current Concerns  Post-Acute Placement   Other Concerns:    SOCIAL WORK ASSESSMENT / PLAN CSW intern introduced self and acknowledged the patient. Patient was alert and oriented. Patient appears to be hard of hearing. CSW intern had to speak with a higher volume of voice when discussing discharge plans with the patient. Patient engaged well in conversation with CSW intern. CSW intern informed the patient about PT/OT recommendation for SNF placement. Patient is agreeable to short term rehab. CSW intern provided the patient with a list of SNF placements in [facility]. Patient informed CSW intern that she would like to speak with her daughter Armando Reichert about her discharge plans. Patient also advised CSW intern to make contact with her daughter to inform her of discharge plans.    CSW intern made contact with the patients daughter to inform her of the patient's decision of seeking short term rehab. CSW intern provided contact information for any questions or concerns. Patient and daughter were very appreciative of the resources provided by CSW intern.   Assessment/plan status:  Psychosocial Support/Ongoing Assessment of Needs Other assessment/ plan:   CSW intern will follow up with patient and daughter about facility  responses.   Information/referral to community resources:   CSW intern provided a list of skilled nursing facilities to the patient.    PATIENT'S/FAMILY'S RESPONSE TO PLAN OF CARE: Patient and daughter was appreciative of the resources provided by CSW intern.   Fernande Boyden, Social Work Intern 07/13/2012

## 2012-07-13 NOTE — Progress Notes (Signed)
INITIAL NUTRITION ASSESSMENT  DOCUMENTATION CODES Per approved criteria  -Morbid Obesity   INTERVENTION: 1. Recommend Prostat supplement BID. Each 30 mL supplement provides 100 kcal and 15 g protein.   NUTRITION DIAGNOSIS: Inadequate oral intake related to altered mental status as evidenced by remote hx of poor PO intake.   Goal: Pt consume >/= 90% of estimated nutrition needs   Monitor:  PO intake, weight, labs  Reason for Assessment: Consult for nutrition assessment   79 y.o. female  Admitting Dx: Sleepiness, weakness, decreased PO intake and altered mental status   ASSESSMENT: Pt is 79 y.o.female with PMH of CAD s/p catheterization, DM2, HTN, Afib, and alzheimer's disease. Pt admitted d/t increased sleepiness, weakness and altered mental status (confusion, sleepiness, agitation) since a fall 1 week ago.  Pt denies any new medications, and states that she has only missed meds lately when she (her daughter) could not get her to get out of bed to take her meds. Head CT did not show any acute changes.   Pt from home, lives with daughter's family. Per pts daughter, pt has decreased PO intake for the last 1 week, when previously she would "eat almost anything". Upon admission, pt denies eating/drinking x 2 days.   RD spoke with pt.  Pt states appetite has returned and denies any weight loss. Pt consumed 100% of breakfast tray.   Height: Ht Readings from Last 1 Encounters:  04/18/10 5' (1.524 m)    Weight: Wt Readings from Last 1 Encounters:  07/12/12 221 lb 3.2 oz (100.336 kg)    Ideal Body Weight: 100 lbs   % Ideal Body Weight: 221%   Wt Readings from Last 10 Encounters:  07/12/12 221 lb 3.2 oz (100.336 kg)  04/18/10 242 lb 12.8 oz (110.133 kg)  02/01/09 233 lb (105.688 kg)  01/23/09 249 lb (112.946 kg)    Usual Body Weight: ~240 lbs  % Usual Body Weight: 92%  BMI:  43.1 - Morbidly obese  Estimated Nutritional Needs: Kcal: 1350 - 1650 Protein: 60 - 75 g  daily   Fluid: 1.5 - 2 L   Skin: Intact, ecchymosis  Diet Order: Carb Control Medium (1600 - 2000 kcal)  EDUCATION NEEDS: -No education needs identified at this time   Intake/Output Summary (Last 24 hours) at 07/13/12 0910 Last data filed at 07/12/12 2225  Gross per 24 hour  Intake    740 ml  Output      0 ml  Net    740 ml    Last BM: 07/12/2012  Labs:   Lab 07/13/12 0450 07/12/12 1338  NA 140 139  K 4.2 4.2  CL 101 101  CO2 26 26  BUN 20 22  CREATININE 0.83 0.91  CALCIUM 10.3 10.3  MG -- --  PHOS -- --  GLUCOSE 103* 141*    CBG (last 3)   Basename 07/12/12 2146  GLUCAP 107*    Scheduled Meds:   . aspirin EC  81 mg Oral Daily  . atenolol  25 mg Oral Daily  . donepezil  10 mg Oral Daily  . enoxaparin (LOVENOX) injection  40 mg Subcutaneous Q24H  . insulin aspart  0-5 Units Subcutaneous QHS  . insulin aspart  0-9 Units Subcutaneous TID WC  . LORazepam  0.5 mg Oral QHS  . memantine  10 mg Oral BID  . omega-3 acid ethyl esters  1 g Oral Daily  . pantoprazole  40 mg Oral Daily  . PARoxetine  40 mg Oral  Daily  . simvastatin  40 mg Oral QHS    Continuous Infusions:   Past Medical History  Diagnosis Date  . DM2 (diabetes mellitus, type 2)   . CAD (coronary artery disease)     cath- 2007  . Alzheimer's dementia   . HLD (hyperlipidemia)   . HTN (hypertension)   . A-fib   . Breast cancer     Past Surgical History  Procedure Date  . Replacement total knee   . Abdominal hysterectomy    Belenda Cruise  Dietetic Intern Pager: [phone]  I agree with the above information. Jarold Motto MS, RD, LDN Pager: [phone] After-hours pager: [phone]

## 2012-07-13 NOTE — Progress Notes (Signed)
Patient is refusing to eat and is pulling at the linens; patient's daughter is concerned that this behavior is different from her baseline.  Patient alert to self only, patient follows commands.  Vitals stable.  Dr. Tye Savoy notified of patient's behavior.  No new orders received.  Will continue to monitor.

## 2012-07-13 NOTE — Clinical Social Work Placement (Addendum)
Clinical Social Work Department CLINICAL SOCIAL WORK PLACEMENT NOTE 07/13/2012  Patient:  Debbie Rodriguez, Debbie Rodriguez  Account Number:  [id number] Admit date:  07/12/2012  Clinical Social Worker:  Genelle Bal, LCSW  Date/time:  07/13/2012 03:51 PM  Clinical Social Work is seeking post-discharge placement for this patient at the following level of care:   SKILLED NURSING   (*CSW will update this form in Epic as items are completed)   07/13/2012  Patient/family provided with [facility] Department of Clinical Social Work's list of facilities offering this level of care within the geographic area requested by the patient (or if unable, by the patient's family).  07/13/2012  Patient/family informed of their freedom to choose among providers that offer the needed level of care, that participate in Medicare, Medicaid or managed care program needed by the patient, have an available bed and are willing to accept the patient.  07/14/12 - Patient/family informed of [facility]' ownership interest in [facility], as well as of the fact that they are under no obligation to receive care at this facility.  PASARR submitted to EDS on 07/14/12 PASARR number received from EDS on 07/14/12  FL2 transmitted to all facilities in geographic area requested by pt/family on 07/14/12  FL2 transmitted to all facilities within larger geographic area on   Patient informed that his/her managed care company has contracts with or will negotiate with  certain facilities, including the following:     Patient/family informed of bed offers received: 07/15/12  Patient chooses bed at [facility] Physician recommends and patient chooses bed at    Patient to be [facility] to [facility] on 07/15/12   Patient to be transferred to facility by ambulance  The following physician request were entered in Epic:   Additional Comments: 07/14/12 - CSW talked with patient's daughter Cristal Deer regarding SNF search. Her  preferences are [facility] in [place] and [facility] in [place]. CSW rec'd call and 'yes' response from [facility]. CSW submitted clinicals to [facility] for preauthorization. Genelle Bal, LCSW 07/15/12 - Patient's ambulance authorization is [id number]. Patient medical packet completed and will accompany her to facility.    Fernande Boyden, Social Work Intern (Initiated placement note) 07/13/2012

## 2012-07-13 NOTE — Progress Notes (Signed)
Interval update  I saw and performed an MMSE at the patients bedside. Patient appears alert and cooperative with the exam.   MMSE total Score: 18/30 Orientation: 6 Registartaion: 1 Attention and calculation: 0 Recall: 3 Language: 8 Copying: 0  Kevin Fenton MD 07/13/2012, 1226

## 2012-07-13 NOTE — Evaluation (Signed)
Occupational Therapy Evaluation Patient Details Name: Debbie Rodriguez MRN: [id number] DOB: August 04, 1924 Today's Date: 07/13/2012 Time: 1191-4782 OT Time Calculation (min): 18 min  OT Assessment / Plan / Recommendation Clinical Impression  Pt admitted with confusion, sleepiness, and agitation s/p fall 1 week ago at home.  PMH of CAD s/p catheterization, DM2, HTN, Afib, and alzheimer's disease. Pt will benefit from acute OT services to address below problem list. Recommending SNF at this time.  No caregiver available during eval, and pt reporting that she is home alone several hours a day.     OT Assessment  Patient needs continued OT Services    Follow Up Recommendations  SNF    Barriers to Discharge Decreased caregiver support Pt reports she is home alone several hrs daily. However, pt is poor historian and no family present to confirm availability of assist at home.  Equipment Recommendations  3 in 1 bedside comode    Recommendations for Other Services    Frequency  Min 2X/week    Precautions / Restrictions     Pertinent Vitals/Pain See vitals    ADL  Grooming: Performed;Wash/dry face;Set up Where Assessed - Grooming: Supported sitting Upper Body Bathing: Simulated;Set up Where Assessed - Upper Body Bathing: Supported sitting Lower Body Bathing: Simulated;Moderate assistance Where Assessed - Lower Body Bathing: Supported sitting Upper Body Dressing: Simulated;Set up Where Assessed - Upper Body Dressing: Supported sitting Lower Body Dressing: Simulated;Moderate assistance Where Assessed - Lower Body Dressing: Supported sitting Toilet Transfer: Simulated;Maximal assistance Toilet Transfer Method: Sit to stand (unable to achieve full stand) Acupuncturist:  (chair) Equipment Used: Gait belt Transfers/Ambulation Related to ADLs: Pt attempted sit<>stand x2 from chair with max assist.  Unable to obtain full standing position and quickly sat back down. ADL Comments: Pt  attempting to doff socks while sitting EOC and leaning anteriorly. Required great effort to be able to reach sock and was unable to doff sock. (Reports she was able to perform LB dressing tasks without assistance PTA)    OT Diagnosis: Cognitive deficits;Generalized weakness  OT Problem List: Decreased strength;Decreased activity tolerance;Impaired balance (sitting and/or standing);Decreased cognition;Decreased knowledge of use of DME or AE;Obesity OT Treatment Interventions: Self-care/ADL training;DME and/or AE instruction;Therapeutic activities;Cognitive remediation/compensation;Patient/family education;Balance training   OT Goals Acute Rehab OT Goals OT Goal Formulation: With patient Time For Goal Achievement: 07/20/12 Potential to Achieve Goals: Good ADL Goals Pt Will Perform Grooming: with supervision;Standing at sink ADL Goal: Grooming - Progress: Goal set today Pt Will Transfer to Toilet: with DME;with min assist;Ambulation;Comfort height toilet ADL Goal: Toilet Transfer - Progress: Goal set today Pt Will Perform Toileting - Clothing Manipulation: with supervision;Standing ADL Goal: Toileting - Clothing Manipulation - Progress: Goal set today Pt Will Perform Toileting - Hygiene: with supervision;Sit to stand from 3-in-1/toilet ADL Goal: Toileting - Hygiene - Progress: Goal set today Miscellaneous OT Goals Miscellaneous OT Goal #1: Pt will perform static standing balance task >5 min at supervision level as precursor for ADL retraining. OT Goal: Miscellaneous Goal #1 - Progress: Goal set today  Visit Information  Last OT Received On: 07/13/12 Assistance Needed: +2    Subjective Data      Prior Functioning     Home Living Lives With: Family Available Help at Discharge: Family;Personal care attendant;Available PRN/intermittently Bathroom Shower/Tub: Engineer, manufacturing systems: Standard Home Adaptive Equipment: Tub transfer bench Prior Function Level of Independence:  Needs assistance Needs Assistance: Bathing Bath: Moderate Comments: Pt reports she has an aide who comes in ~4 hrs daily  while daughter is at work and assist pt with her shower.  Communication Communication: HOH Dominant Hand: Right         Vision/Perception Vision - Assessment Additional Comments: Pt has glasses but they are currently at home.   Cognition  Overall Cognitive Status: Impaired Area of Impairment: Awareness of deficits Difficult to assess due to: Hard of hearing/deaf Arousal/Alertness: Awake/alert Behavior During Session: [facility] for tasks performed Awareness of Deficits: Pt reports she believes she could go home today without assistance (despite 2 attempts to stand with great difficulty). Cognition - Other Comments: Slow to process questions regarding home living and PLOF.    Extremity/Trunk Assessment Right Upper Extremity Assessment RUE ROM/Strength/Tone: [facility] for tasks assessed Left Upper Extremity Assessment LUE ROM/Strength/Tone: WFL for tasks assessed     Mobility Bed Mobility Bed Mobility: Not assessed (pt up in chair) Transfers Transfers: Sit to Stand;Stand to Sit Sit to Stand: 2: Max assist;From chair/3-in-1;With upper extremity assist;With armrests Stand to Sit: 2: Max assist;To chair/3-in-1;With upper extremity assist Details for Transfer Assistance: Assist for power up from chair for steadying. Pt unable to acheive full stand and quickly sat back down in chair.     Shoulder Instructions     Exercise     Balance     End of Session OT - End of Session Equipment Utilized During Treatment: Gait belt Activity Tolerance: Patient tolerated treatment well Patient left: with call bell/phone within reach;in chair;with chair alarm set  GO    07/13/2012 Cipriano Mile OTR/L Pager [phone] Office [phone]  Cipriano Mile 07/13/2012, 11:29 AM

## 2012-07-13 NOTE — Progress Notes (Signed)
*  PRELIMINARY RESULTS* Vascular Ultrasound Lower extremity venous duplex has been completed.  Preliminary findings: Bilateral:  No evidence of DVT or Baker's Cyst.    Farrel Demark, RDMS, RVT 07/13/2012, 8:44 AM

## 2012-07-14 LAB — GLUCOSE, CAPILLARY

## 2012-07-14 LAB — URINE CULTURE: Colony Count: 40000

## 2012-07-14 MED ORDER — MIRTAZAPINE 7.5 MG PO TABS
7.5000 mg | ORAL_TABLET | Freq: Every day | ORAL | Status: DC
  Administered 2012-07-14: 7.5 mg via ORAL
  Filled 2012-07-14 (×2): qty 1

## 2012-07-14 NOTE — Discharge Summary (Signed)
Family Medicine Teaching [facility] Discharge Summary  Patient name: Debbie Rodriguez Medical record number: [id number] Date of birth: May 27, 1925 Age: 79 y.o. Gender: female Date of Admission: 07/12/2012  Date of Discharge: 07/15/2012 Admitting Physician: Tobin Chad, MD  Primary Care Provider: Ellin Mayhew, MD  Indication for Hospitalization: sleepiness, altered mental status, decreased PO intake Discharge Diagnoses:  1. Alzheimer's dementia 2. Dizziness 3. Hallucinations 4. Leg pain 5. DM2 6. CAD 7. Afib- chronic  Consultations: None  Significant Labs and Imaging:   Lab 07/13/12 0450 07/12/12 1338  HGB 14.8 15.4*  HCT 45.4 47.1*  WBC 6.3 6.4  PLT 182 153    Lab 07/13/12 0450 07/12/12 1338  NA 140 139  K 4.2 4.2  CL 101 101  CO2 26 26  GLUCOSE 103* 141*  BUN 20 22  CREATININE 0.83 0.91  CALCIUM 10.3 10.3  MG -- --  PHOS -- --   Catheterized specimen of urine:  07/12/2012 23:40  Color, Urine YELLOW  APPearance CLOUDY (A)  Specific Gravity, Urine 1.022  pH 5.5  Glucose NEGATIVE  Bilirubin Urine MODERATE (A)  Ketones, ur 40 (A)  Protein 100 (A)  Urobilinogen, UA 0.2  Nitrite NEGATIVE  Leukocytes, UA NEGATIVE  Hgb urine dipstick NEGATIVE  Urine-Other MUCOUS PRESENT  WBC, UA 0-2  RBC / HPF 0-2  Squamous Epithelial / LPF FEW (A)  Bacteria, UA FEW (A)   CXR 07/12/2012  IMPRESSION:  Low lung volumes. No acute findings.   CT Head 07/12/2012  IMPRESSION:  1. No acute intracranial abnormality.  2. Mild atrophy and chronic microvascular white matter ischemic  Changes.   CT cervical spine 07/12/2012  IMPRESSION:  1. No acute fracture or subluxation.  2. Mild spondylosis.   DG Knee L 07/12/2012  IMPRESSION:  1. No acute findings.  2. Medial compartment degenerative spurring.  3. Mild chondrocalcinosis.   DG R Knee 07/12/2012  IMPRESSION:  Prior total knee arthroplasty. No acute findings or other  significant abnormality identified.   BL  Venous duplex 07/13/2012  Lower extremity venous duplex has been completed. Preliminary findings: Bilateral: No evidence of DVT or Baker's Cyst.   Procedures: None  Brief Hospital Course:  Debbie Rodriguez is a 79 y.o. year old female with PMH of CAD s/p catheterization, DM2, HTN, Afib, and alzheimer's disease presenting with confusion, sleepiness, and agitation after a fall 1 week ago.   Altered mental status and falls  She described her fall as due to weakness, not dizziness or tripping. A CT head was performed with no acute findings. A CBC did not show any leukocytosis, a CXR was unremarkable, and a UA did not show any findings consistent with UTI. After all of her work up we feel that her current mental status is likely due to progression of her pre-existing alzheimer's dementia. We trie dto minimize drugs with Beer's criteria so we decreased her benzodiazepine to once daily, and stopped nitrofurantoin daily. She had been refusing food and her sleep schedule was off so we started Remeron q nightly. We continued namenda and aricept.   An MMSE was performed and shescored an 18/30. The details of this exam are listed below   Orientation: 6    Registartaion: 1    Attention and calculation: 0    Recall: 3    Language: 8    Copying: 0  Dizziness Initially felt to be orthostatic but orthostatic vital signs did not agree. With her limited sit to stand exam (standing for a short  time with great effort and preserved extremity strength) we feel that she is likely deconditioned and would benefit from some intensive PT.   Hallucinations/agitation  We feel this is an extension of her progressing dementia. We did not have any agitation events or obstructive hallucinations during admission. Treated with Ativan, remeron, paxil, namenda, and aricept as above. Lewy body dementia was considered but   Leg pain - continued  Has an unclear etiology and per her report is related to her recent falls. XRays were  performed on her BL knees which were unremarkable and BL venous dopplers on her lower extremities did not show any clots. Unchanged throughout admission.    DM2  Her home dose of glipizide was held and she was managed with SSI. Her A1C was found to be 6.5.   CAD s/p catheterizations  Continued ASA, BB, and statin   Afib  She was monitored on tele, remained rate controlled, and not anticoagulated as she is such a fall risk.   Code Status: DNR/DNI Dispo: SNF- [facility]   Discharge Medications:    Medication List     As of 07/14/2012 12:20 PM    ASK your doctor about these medications         acetaminophen 500 MG tablet   Commonly known as: TYLENOL   Take 500 mg by mouth every 4 (four) hours as needed. For pain      aspirin 81 MG EC tablet   Take 81 mg by mouth daily.      atenolol 25 MG tablet   Commonly known as: TENORMIN   Take 25 mg by mouth daily.      CENTRUM SILVER PO   Take 1 tablet by mouth daily.      diclofenac 75 MG EC tablet   Commonly known as: VOLTAREN   Take 75 mg by mouth 2 (two) times daily.      diclofenac 75 MG EC tablet   Commonly known as: VOLTAREN   Take 75 mg by mouth 2 (two) times daily as needed. For pain      donepezil 10 MG tablet   Commonly known as: ARICEPT   Take 10 mg by mouth daily.      Fish Oil 1200 MG Caps   Take 1,200 mg by mouth daily.      glipiZIDE 10 MG tablet   Commonly known as: GLUCOTROL   Take 10 mg by mouth daily.      LORazepam 0.5 MG tablet   Commonly known as: ATIVAN   Take 0.5 mg by mouth 2 (two) times daily.      memantine 10 MG tablet   Commonly known as: NAMENDA   Take 10 mg by mouth 2 (two) times daily.      nitrofurantoin 100 MG capsule   Commonly known as: MACRODANTIN   Take 100 mg by mouth daily.      omeprazole 20 MG capsule   Commonly known as: PRILOSEC   Take 20 mg by mouth daily.      PARoxetine 40 MG tablet   Commonly known as: PAXIL   Take 40 mg by mouth daily.      simvastatin 40  MG tablet   Commonly known as: ZOCOR   Take 40 mg by mouth at bedtime.      tolterodine 2 MG tablet   Commonly known as: DETROL   Take 2 mg by mouth 2 (two) times daily.       Issues for Follow Up:  -  Serial MMSEs- scored 18 as detailed above during admission - minimize drugs that might cause falls - Monitor for recurrent UTI as she was on daily nitrofurantoin and stopped during admission.   Outstanding Results: None  Discharge Instructions: Please refer to Patient Instructions section of EMR for full details.  Patient was counseled important signs and symptoms that should prompt return to medical care, changes in medications, dietary instructions, activity restrictions, and follow up appointments.    Discharge Condition: Stable  Kevin Fenton, MD 07/14/2012, 12:20 PM

## 2012-07-14 NOTE — Progress Notes (Signed)
Family Medicine Teaching Service Daily Progress Note Service Page: [phone]  Subjective:  Denies pain of any kind, states she did not stay awake all night.   Objective: Temp:  [97.4 F (36.3 C)-98 F (36.7 C)] 97.4 F (36.3 C) (01/22 0800) Pulse Rate:  [66-80] 80  (01/22 0800) Resp:  [18-20] 18  (01/22 0800) BP: (110-143)/(65-78) 126/66 mmHg (01/22 0800) SpO2:  [94 %-100 %] 95 % (01/22 0800) Exam: Gen: NAD, somnolent, appears well otherwise HEENT: NCAT, PERRL CV: RRR, good S1/S2, 3/6 systolic murmur  Resp: CTABL, no wheezes, non-labored  Abd: SNTND, no guarding Ext: No significant edema, BL tenderness to palpation, no erythema or warmth.  Neuro: somnolent, oriented to person only, very somnolent  I have reviewed the patient's medications, labs, imaging, and diagnostic testing.  Notable results are summarized below.  CBC BMET   Lab 07/13/12 0450 07/12/12 1338  WBC 6.3 6.4  HGB 14.8 15.4*  HCT 45.4 47.1*  PLT 182 153    Lab 07/13/12 0450 07/12/12 1338  NA 140 139  K 4.2 4.2  CL 101 101  CO2 26 26  BUN 20 22  CREATININE 0.83 0.91  GLUCOSE 103* 141*  CALCIUM 10.3 10.3     Imaging/Diagnostic Tests: CXR 07/12/2012  IMPRESSION:  Low lung volumes. No acute findings.   CT Head 07/12/2012  IMPRESSION:  1. No acute intracranial abnormality.  2. Mild atrophy and chronic microvascular white matter ischemic  Changes.   CT cervical spine 07/12/2012  IMPRESSION:  1. No acute fracture or subluxation.  2. Mild spondylosis.   DG Knee L 07/12/2012  IMPRESSION:  1. No acute findings.  2. Medial compartment degenerative spurring.  3. Mild chondrocalcinosis.   DG R Knee 07/12/2012  IMPRESSION:  Prior total knee arthroplasty. No acute findings or other  significant abnormality identified.  BL Venous duplex 07/13/2012 Lower extremity venous duplex has been completed. Preliminary findings: Bilateral: No evidence of DVT or Baker's Cyst.  Scheduled Meds:    . aspirin EC   81 mg Oral Daily  . atenolol  25 mg Oral Daily  . donepezil  10 mg Oral Daily  . enoxaparin (LOVENOX) injection  40 mg Subcutaneous Q24H  . feeding supplement  30 mL Oral BID WC  . insulin aspart  0-5 Units Subcutaneous QHS  . insulin aspart  0-9 Units Subcutaneous TID WC  . LORazepam  0.5 mg Oral QHS  . memantine  10 mg Oral BID  . omega-3 acid ethyl esters  1 g Oral Daily  . pantoprazole  40 mg Oral Daily  . PARoxetine  40 mg Oral Daily  . simvastatin  40 mg Oral QHS   Continuous Infusions:  PRN Meds:.acetaminophen, acetaminophen, polyethylene glycol  Plan: Debbie Rodriguez is a 79 y.o. year old female with PMH of CAD s/p catheterization, DM2, HTN, Afib, and alzheimer's disease presenting with confusion, sleepiness, and agitation after a fall 1 week ago.  Altered mental status and falls  - Possibly progression of alzheimer's dementia  - No leukocytosis, UA without LE or nitrites, afebrile, no complaints - check Vit D level is 39 - PT/OT reccomending SNF and patient is ammenable - half dose benzodiazepine and hold nitrofurantoin  - adding remeron qnightly as appetite seems to be an issue with her refusing food and her sleep schedule is off - continue namenda and aricept.   Dizziness  - consistent with orthostatic hypotension from her Hx, however orthostatic vital signs obtained show less than a 20/10 fall in  BP  - Possibly deconditioning - half dose benzodiazepine and hold nitrofurantoin  - Monitor BP for need of antihypertensives.   Hallucinations/agitation  - Possibly component of dementia and possibly due to psychogenic etiology  - No Hx of psychiatric d/o, although she is on paxil and ativan  - Plan to place sitter if needed and low dose IV haldol for agitation not helped by redirection.  - remeron, paxil, and ativan as above  Leg pain - continued - With Hx of recent significant bed bound state and BL tenderness vs from recent falls - Xrays with no acute findings BL -  Venous duplex shows no evidence of clot  DM2  - A1C is 6.5, CBGs 149-257 - Hold home glipizide, SSI   CAD s/p catheterizations  - Continue ASA, BB, and statin   Afib  - rate controlled - Continue BB, monitor on tele   FEN/GI: carb modified, nutrition consult, SLIV  Prophylaxis: Lovenox  Disposition: med surg, SNF when bed available and discussed with family and patient.  Code Status: DNR/DNI   Kevin Fenton, MD 07/14/2012, 9:12 AM

## 2012-07-14 NOTE — Progress Notes (Signed)
I examined this patient and discussed the care plan with Dr Ermalinda Memos and the [facility] team and agree with assessment and plan as documented in the progress note above. She was still drowsy in the afternoon, and had uneaten trays from breakfast and lunch. She awakens easily then goes back to sleep. I agree with Mirtazapine which will hopefully help increase her appetite and shift her awake cycle back to daytime.

## 2012-07-14 NOTE — Progress Notes (Signed)
Patient lost IV access. Dr Ermalinda Memos was notified and he stated it was fine for patient to be without IV access.

## 2012-07-14 NOTE — Progress Notes (Signed)
Occupational Therapy G Code  Late entry from 07/13/12: Functional Assessment Tool Used: clinical judgement Functional Limitation: Self care Self Care Current Status [phone]): At least 40 percent but less than 60 percent impaired, limited or restricted Self Care Goal Status (U0454): At least 1 percent but less than 20 percent impaired, limited or restricted   07/14/2012 Cipriano Mile OTR/L Pager [phone] Office [phone]  Cipriano Mile 07/14/2012, 12:38 PM

## 2012-07-15 LAB — GLUCOSE, CAPILLARY: Glucose-Capillary: 224 mg/dL — ABNORMAL HIGH (ref 70–99)

## 2012-07-15 MED ORDER — LORAZEPAM 0.5 MG PO TABS: 0.5000 mg | ORAL_TABLET | Freq: Every day | ORAL | Status: DC

## 2012-07-15 MED ORDER — MIRTAZAPINE 7.5 MG PO TABS: 7.5000 mg | ORAL_TABLET | Freq: Every day | ORAL | Status: DC

## 2012-07-15 MED ORDER — PRO-STAT SUGAR FREE PO LIQD: 30.0000 mL | Freq: Two times a day (BID) | ORAL | Status: DC

## 2012-07-15 NOTE — Progress Notes (Signed)
Physical Therapy Treatment Patient Details Name: Debbie Rodriguez MRN: [id number] DOB: 1924/10/25 Today's Date: 07/15/2012 Time: 1478-2956 PT Time Calculation (min): 24 min  PT Assessment / Plan / Recommendation Comments on Treatment Session  Pt. made good gains in therapy session today and with improving activity tolerance.  Daughter present for initial part of session and states her mom appears to be having a much improved day today.    Follow Up Recommendations  SNF     Does the patient have the potential to tolerate intense rehabilitation     Barriers to Discharge        Equipment Recommendations  None recommended by PT    Recommendations for Other Services    Frequency Min 3X/week   Plan Discharge plan remains appropriate    Precautions / Restrictions Precautions Precautions: Fall Restrictions Weight Bearing Restrictions: No   Pertinent Vitals/Pain No pain, no distress    Mobility  Bed Mobility Bed Mobility: Supine to Sit;Sitting - Scoot to Edge of Bed Supine to Sit: 3: Mod assist;With rails;HOB elevated Sitting - Scoot to Edge of Bed: 3: Mod assist Details for Bed Mobility Assistance: technique and safety cues Transfers Transfers: Sit to Stand;Stand to Sit Sit to Stand: 3: Mod assist;From bed;With upper extremity assist;4: Min assist;From chair/3-in-1;With armrests Stand to Sit: 4: Min assist;With armrests;To chair/3-in-1 Details for Transfer Assistance: needed a little more help stnading up from bed than from recliner.  Needed cues for safe technique and hand plcaement. Ambulation/Gait Ambulation/Gait Assistance: 1: +2 Total assist;Other (comment) (second person for safety and to push recliner) Ambulation/Gait: Patient Percentage: 70% Ambulation Distance (Feet): 60 Feet Assistive device: Rolling walker Ambulation/Gait Assistance Details: Needed frequent cues to take longer steps as she would tend to revert to shuffle/short steps ofter.  she was able to override  this with cueing  and increase step length to appropriate length.  She was able to stand at sink with min guard assist while brushing teeth and washing face, with recliner behind her for safety.  Tolerated 5 minutes of standing. Gait Pattern: Shuffle Gait velocity: slowed Stairs: No Wheelchair Mobility Wheelchair Mobility: No    Exercises     PT Diagnosis:    PT Problem List:   PT Treatment Interventions:     PT Goals Acute Rehab PT Goals PT Goal: Supine/Side to Sit - Progress: Progressing toward goal PT Goal: Sit to Stand - Progress: Progressing toward goal PT Goal: Stand to Sit - Progress: Progressing toward goal PT Goal: Ambulate - Progress: Progressing toward goal  Visit Information  Last PT Received On: 07/15/12 Assistance Needed: +2 PT/OT Co-Evaluation/Treatment: Yes    Subjective Data  Subjective: Oh yes I want to get up   Cognition  Overall Cognitive Status: Impaired Area of Impairment: Awareness of deficits Difficult to assess due to: Hard of hearing/deaf Arousal/Alertness: Awake/alert Orientation Level: Appears intact for tasks assessed Behavior During Session: [facility] for tasks performed Awareness of Deficits: began to sit down without preparation while standing at the sink    Balance     End of Session PT - End of Session Equipment Utilized During Treatment: Gait belt Activity Tolerance: Patient tolerated treatment well;Patient limited by fatigue Patient left: in chair;with chair alarm set;with call bell/phone within reach Nurse Communication: Mobility status   GP     Ferman Hamming 07/15/2012, 9:38 AM Weldon Picking PT Acute Rehab Services [phone] [place] [phone]

## 2012-07-15 NOTE — Progress Notes (Signed)
Family Medicine Teaching Service Daily Progress Note Service Page: [phone]  Subjective:  Feeling well this am, good appetite and states she ate well yesterday. Agreeable to go to SNF but likes it here also. No compliants.   Objective: Temp:  [97.1 F (36.2 C)-97.8 F (36.6 C)] 97.8 F (36.6 C) (01/23 0735) Pulse Rate:  [66-81] 71  (01/23 0735) Resp:  [18-20] 20  (01/23 0735) BP: (111-121)/(67-72) 121/67 mmHg (01/23 0735) SpO2:  [95 %-98 %] 96 % (01/23 0735) Weight:  [223 lb 12.3 oz (101.5 kg)] 223 lb 12.3 oz (101.5 kg) (01/22 2105) Exam: Gen: NAD, somnolent but awakes easily, appears well otherwise HEENT: NCAT, PERRL CV: RRR, good S1/S2, 3/6 systolic murmur  Resp: CTABL, no wheezes, non-labored  Abd: SNTND, no guarding Ext: No significant edema, BL tenderness to palpation, no erythema or warmth.  Neuro: A&O x 3, no gross deficits  I have reviewed the patient's medications, labs, imaging, and diagnostic testing.  Notable results are summarized below.  CBC BMET   Lab 07/13/12 0450 07/12/12 1338  WBC 6.3 6.4  HGB 14.8 15.4*  HCT 45.4 47.1*  PLT 182 153    Lab 07/13/12 0450 07/12/12 1338  NA 140 139  K 4.2 4.2  CL 101 101  CO2 26 26  BUN 20 22  CREATININE 0.83 0.91  GLUCOSE 103* 141*  CALCIUM 10.3 10.3     Imaging/Diagnostic Tests: CXR 07/12/2012  IMPRESSION:  Low lung volumes. No acute findings.   CT Head 07/12/2012  IMPRESSION:  1. No acute intracranial abnormality.  2. Mild atrophy and chronic microvascular white matter ischemic  Changes.   CT cervical spine 07/12/2012  IMPRESSION:  1. No acute fracture or subluxation.  2. Mild spondylosis.   DG Knee L 07/12/2012  IMPRESSION:  1. No acute findings.  2. Medial compartment degenerative spurring.  3. Mild chondrocalcinosis.   DG R Knee 07/12/2012  IMPRESSION:  Prior total knee arthroplasty. No acute findings or other  significant abnormality identified.  BL Venous duplex 07/13/2012 Lower extremity  venous duplex has been completed. Preliminary findings: Bilateral: No evidence of DVT or Baker's Cyst.  Scheduled Meds:    . aspirin EC  81 mg Oral Daily  . atenolol  25 mg Oral Daily  . donepezil  10 mg Oral Daily  . enoxaparin (LOVENOX) injection  40 mg Subcutaneous Q24H  . feeding supplement  30 mL Oral BID WC  . insulin aspart  0-5 Units Subcutaneous QHS  . insulin aspart  0-9 Units Subcutaneous TID WC  . LORazepam  0.5 mg Oral QHS  . memantine  10 mg Oral BID  . mirtazapine  7.5 mg Oral QHS  . omega-3 acid ethyl esters  1 g Oral Daily  . pantoprazole  40 mg Oral Daily  . PARoxetine  40 mg Oral Daily  . simvastatin  40 mg Oral QHS   Continuous Infusions:  PRN Meds:.acetaminophen, acetaminophen, polyethylene glycol  Plan: Debbie Rodriguez is a 79 y.o. year old female with PMH of CAD s/p catheterization, DM2, HTN, Afib, and alzheimer's disease presenting with confusion, sleepiness, and agitation after a fall 1 week ago.  Altered mental status and falls  - Most likely progression of alzheimer's dementia  - No leukocytosis, UA without LE or nitrites, afebrile, no complaints - Vit D level is 39 - PT/OT reccomending SNF and patient is ammenable - half dose benzodiazepine and hold nitrofurantoin  - Remeron q nightly as appetite seems to be an issue with her  refusing food and her sleep schedule is off. Good appetite this am.  - continue namenda and aricept.   Dizziness  - consistent with orthostatic hypotension from her Hx, however orthostatic vital signs obtained show less than a 20/10 fall in BP  - Possibly deconditioning related - half dose benzodiazepine and hold nitrofurantoin  - Monitor BP for need of antihypertensives.   Hallucinations/agitation  - Very likely component of dementia - No Hx of psychiatric d/o, although she is on paxil and ativan  - remeron, paxil, and ativan as above  Leg pain - continued - With Hx of recent significant bed bound state and BL tenderness  vs from recent falls - Xrays with no acute findings BL - Venous duplex shows no evidence of clot  DM2  - A1C is 6.5, CBGs 132-174 - Hold home glipizide  - SSI   CAD s/p catheterizations  - Continue ASA, BB, and statin   Afib  - rate controlled - Continue BB  FEN/GI: carb modified, nutrition consult, SLIV  Prophylaxis: Lovenox  Disposition: Likely to [facility] today, pre-auth pending, appreciate social work's assistance.   Code Status: DNR/DNI   Kevin Fenton, MD 07/15/2012, 9:18 AM

## 2012-07-15 NOTE — Progress Notes (Signed)
Patient was discharged by ambulance to [facility]. Patients daughter was contacted by SW about the move. Patient was stable upon discharge.

## 2012-07-15 NOTE — Progress Notes (Signed)
I examined this patient and discussed the care plan with Dr Bradshaw and the FPTS team and agree with assessment and plan as documented in the progress note above.  

## 2012-07-15 NOTE — Progress Notes (Signed)
Occupational Therapy Treatment Patient Details Name: Debbie Rodriguez MRN: [id number] DOB: 1924-08-05 Today's Date: 07/15/2012 Time: 4098-1191 OT Time Calculation (min): 33 min  OT Assessment / Plan / Recommendation Comments on Treatment Session This 79 yo female making progress since eval on 07/13/2012. Will continue to benefit from acute OT with follow up at SNF.    Follow Up Recommendations  SNF       Equipment Recommendations  3 in 1 bedside comode       Frequency Min 2X/week   Plan Discharge plan remains appropriate    Precautions / Restrictions Precautions Precautions: Fall Restrictions Weight Bearing Restrictions: No       ADL  Grooming: Performed;Wash/dry face;Teeth care;Min guard Where Assessed - Grooming: Supported standing Toilet Transfer: Simulated;Minimal assistance (with +1 for safety) Toilet Transfer Method: Sit to stand Toilet Transfer Equipment:  (Bed, out into hallway with recliner behind her) Transfers/Ambulation Related to ADLs: Min A sit to stand and stand to sit, min A with +1 additional for safety and recliner if needed.      OT Goals ADL Goals ADL Goal: Grooming - Progress: Progressing toward goals ADL Goal: Toilet Transfer - Progress: Progressing toward goals Miscellaneous OT Goals OT Goal: Miscellaneous Goal #1 - Progress: Progressing toward goals  Visit Information  Last OT Received On: 07/15/12 Assistance Needed: +2 (for ambulation) PT/OT Co-Evaluation/Treatment: Yes          Cognition  Overall Cognitive Status: History of cognitive impairments - at baseline Area of Impairment: Awareness of deficits Difficult to assess due to: Hard of hearing/deaf Arousal/Alertness: Awake/alert Orientation Level: Appears intact for tasks assessed Behavior During Session: [facility] for tasks performed Awareness of Deficits: began to sit down without preparation while standing at the sink Cognition - Other Comments: While standing at sink she started to sit  down without saying anything and she still needed to finish brushing her teeth    Mobility   Bed Mobility Bed Mobility: Supine to Sit;Sitting - Scoot to Edge of Bed Supine to Sit: 3: Mod assist;With rails;HOB elevated Sitting - Scoot to Edge of Bed: 3: Mod assist Details for Bed Mobility Assistance: technique and safety cues Transfers Sit to Stand: 3: Mod assist;From bed;With upper extremity assist;4: Min assist;From chair/3-in-1;With armrests Stand to Sit: 4: Min assist;With armrests;To chair/3-in-1 Details for Transfer Assistance: needed a little more help stnading up from bed than from recliner.  Needed cues for safe technique and hand plcaement.             End of Session OT - End of Session Equipment Utilized During Treatment: Gait belt (RW) Activity Tolerance: Patient tolerated treatment well Patient left: in chair;with call bell/phone within reach;with chair alarm set       Evette Georges [phone] 07/15/2012, 10:19 AM

## 2012-07-17 NOTE — Discharge Summary (Signed)
I examined this patient and discussed the care plan with Dr Bradshaw and the FPTS team and agree with assessment and plan as documented in the discharge note above.  

## 2012-07-22 ENCOUNTER — Ambulatory Visit: Payer: Self-pay

## 2012-09-13 ENCOUNTER — Other Ambulatory Visit: Payer: Self-pay | Admitting: *Deleted

## 2012-09-13 MED ORDER — LORAZEPAM 0.5 MG PO TABS: 0.5000 mg | ORAL_TABLET | Freq: Every day | ORAL | Status: DC

## 2012-10-12 ENCOUNTER — Other Ambulatory Visit: Payer: Self-pay | Admitting: *Deleted

## 2012-10-12 MED ORDER — LORAZEPAM 0.5 MG PO TABS: ORAL_TABLET | ORAL | Status: DC

## 2012-10-18 ENCOUNTER — Encounter: Payer: Self-pay | Admitting: Nurse Practitioner

## 2012-11-08 ENCOUNTER — Non-Acute Institutional Stay (SKILLED_NURSING_FACILITY): Payer: Medicare Other | Admitting: Nurse Practitioner

## 2012-11-08 DIAGNOSIS — R5381 Other malaise: Secondary | ICD-10-CM

## 2012-11-08 DIAGNOSIS — J441 Chronic obstructive pulmonary disease with (acute) exacerbation: Secondary | ICD-10-CM

## 2012-11-08 DIAGNOSIS — F0390 Unspecified dementia without behavioral disturbance: Secondary | ICD-10-CM

## 2012-11-08 DIAGNOSIS — I10 Essential (primary) hypertension: Secondary | ICD-10-CM

## 2012-11-08 NOTE — Progress Notes (Signed)
  Subjective:    Patient ID: Debbie Rodriguez, female    DOB: 12/24/1924, 79 y.o.   MRN: [id number]  HPI Comments: I was asked to see patient for palliative care eval. The staff has reported that Debbie Rodriguez is less active now, choosing not to get out of bed most days. Staff reports decreased appetite, weight is stable at 236. She has hx of depression and is taking Paxil 40mg  po qd, and being seen by [facility] psych service here at facility. For Dementia she is taking Namenda and Aricept. She was treated for uti and pneumonia last month. She has been afebrile and vitals are stable. Staff and family note a wheeze on inspiration and expiration, she is on duonebs and lasix for chf. I feel her weight contributes to wheezing when reclined, as wheezing goes away when she gets up oob, which she declines regularly. She has two daughters that are nurses, one lives here and the other in [place]. Pt's daughter Debbie Rodriguez who lives here has requested the consult and feels like pt is giving up, and would like to discuss goals of care and maybe MOST form. Recent labs wnl, cbg's have been less than 200.   The following portions of the patient's history were reviewed and updated as appropriate: allergies, current medications, past family history, past medical history, past social history, past surgical history and problem list.  Review of Systems  Constitutional: Positive for activity change and fatigue.  HENT: Negative.   Eyes: Negative.   Respiratory: Negative.   Cardiovascular: Negative.   Gastrointestinal: Negative.   Endocrine: Negative.   Genitourinary: Negative.   Allergic/Immunologic: Negative.   Neurological: Negative.   Hematological: Negative.   Psychiatric/Behavioral: Negative.        Objective:   Physical Exam  Vitals reviewed. Constitutional: She appears well-developed and well-nourished. No distress.  Obese body habitus  Cardiovascular: Normal rate and regular rhythm.   Pulmonary/Chest: She has  wheezes.  Neurological: She is alert.  Skin: Skin is warm. She is not diaphoretic.  Psychiatric: Her affect is blunt.    LABS 10/19/12: wbc 3.8, hgb 14.4, hct 42.8, plt 146, na 139, k 4.2, glucose 114, bun 28, creatinine 1.07, calcium 9.4     Assessment & Plan:  Pt was seen today for continued cognitive and physical decline. 1) Palliative Care consult per family request, [place] or [facility] - confirm with family. To discuss goals of care with history of dementia taking Aricept and Namenda. 2) cbc, bmp, Vitamin D level, BNP, tsh, vitamin b12, folic acid, in am to r/o metabolic cause.  3) will follow up chest xray due to wheeze and hx of chf and recent pneumonia.

## 2012-11-09 ENCOUNTER — Encounter: Payer: Self-pay | Admitting: Nurse Practitioner

## 2013-01-10 ENCOUNTER — Ambulatory Visit: Payer: Self-pay | Admitting: Internal Medicine

## 2013-01-13 ENCOUNTER — Encounter: Payer: Self-pay | Admitting: Adult Health

## 2013-01-13 ENCOUNTER — Non-Acute Institutional Stay (SKILLED_NURSING_FACILITY): Payer: Medicare Other | Admitting: Adult Health

## 2013-01-13 DIAGNOSIS — F341 Dysthymic disorder: Secondary | ICD-10-CM

## 2013-01-13 DIAGNOSIS — E119 Type 2 diabetes mellitus without complications: Secondary | ICD-10-CM

## 2013-01-13 DIAGNOSIS — R609 Edema, unspecified: Secondary | ICD-10-CM

## 2013-01-13 DIAGNOSIS — F329 Major depressive disorder, single episode, unspecified: Secondary | ICD-10-CM

## 2013-01-13 DIAGNOSIS — R32 Unspecified urinary incontinence: Secondary | ICD-10-CM

## 2013-01-13 DIAGNOSIS — I10 Essential (primary) hypertension: Secondary | ICD-10-CM

## 2013-01-13 DIAGNOSIS — K219 Gastro-esophageal reflux disease without esophagitis: Secondary | ICD-10-CM

## 2013-01-13 DIAGNOSIS — K5900 Constipation, unspecified: Secondary | ICD-10-CM

## 2013-01-13 DIAGNOSIS — F0390 Unspecified dementia without behavioral disturbance: Secondary | ICD-10-CM

## 2013-01-13 DIAGNOSIS — R062 Wheezing: Secondary | ICD-10-CM

## 2013-01-14 ENCOUNTER — Encounter: Payer: Self-pay | Admitting: Adult Health

## 2013-01-14 DIAGNOSIS — K5900 Constipation, unspecified: Secondary | ICD-10-CM | POA: Insufficient documentation

## 2013-01-14 DIAGNOSIS — F0390 Unspecified dementia without behavioral disturbance: Secondary | ICD-10-CM | POA: Insufficient documentation

## 2013-01-14 DIAGNOSIS — F329 Major depressive disorder, single episode, unspecified: Secondary | ICD-10-CM | POA: Insufficient documentation

## 2013-01-14 DIAGNOSIS — R609 Edema, unspecified: Secondary | ICD-10-CM | POA: Insufficient documentation

## 2013-01-14 DIAGNOSIS — R062 Wheezing: Secondary | ICD-10-CM | POA: Insufficient documentation

## 2013-01-14 MED ORDER — MORPHINE SULFATE (CONCENTRATE) 10 MG /0.5 ML PO SOLN: 5.0000 mg | ORAL | Status: DC | PRN

## 2013-01-14 NOTE — Assessment & Plan Note (Signed)
She is no peripheral edema present; will continue lasix 20 mg daily with k+ 10 meq daily; and will monitor

## 2013-01-14 NOTE — Assessment & Plan Note (Signed)
She is worse; will continue her duoneb twice daily and every 6 hours as needed; will give her an extra dose of lasix 20 mg will begin her roxanol 20 mg/ml will give 5 mg every 2 hours as needed for pain or distress and will monitor her status

## 2013-01-14 NOTE — Assessment & Plan Note (Addendum)
She is stable will conitnue atenolol 25 mg daily; hydralazine 25 mg every 8 hours asa 81 mg daily  and will monitor

## 2013-01-14 NOTE — Progress Notes (Signed)
Patient ID: Debbie Rodriguez, female   DOB: [birth year], 79 y.o.   MRN: [id number]  [facility]  Allergies  Allergen Reactions  . Penicillins     REACTION: Reaction not known  . Sulfonamide Derivatives      Chief Complaint  Patient presents with  . Medical Managment of Chronic Issues    HPI: She is being seen for the management of her chronic illnesses. She is being followed by hospice care. The hospice nurse reports that she is wheezing more. Her recent chest x-ray did not demonstrate pneumonia. Her family desires for her to have comfort care.   Past Medical History  Diagnosis Date  . DM2 (diabetes mellitus, type 2)   . CAD (coronary artery disease)     cath- 2007  . Alzheimer's dementia   . HLD (hyperlipidemia)   . HTN (hypertension)   . A-fib   . Breast cancer     Past Surgical History  Procedure Laterality Date  . Replacement total knee    . Abdominal hysterectomy      VITAL SIGNS BP 128/70  Pulse 68   Patient's Medications  New Prescriptions   No medications on file  Previous Medications   ACETAMINOPHEN (TYLENOL) 500 MG TABLET    Take 500 mg by mouth every 4 (four) hours as needed. For pain   ASPIRIN 81 MG EC TABLET    Take 81 mg by mouth daily.     ATENOLOL (TENORMIN) 25 MG TABLET    Take 25 mg by mouth daily.     BUSPIRONE (BUSPAR) 5 MG TABLET    Take 5 mg by mouth 2 (two) times daily.   FEEDING SUPPLEMENT (PRO-STAT SUGAR FREE 64) LIQD    Take 30 mLs by mouth 2 (two) times daily with a meal. 1 hour prior to meal.   FUROSEMIDE (LASIX) 20 MG TABLET    Take 20 mg by mouth daily.   HYDRALAZINE (APRESOLINE) 25 MG TABLET    Take 25 mg by mouth 3 (three) times daily.   INSULIN GLARGINE (LANTUS) 100 UNIT/ML INJECTION    Inject 20 Units into the skin at bedtime.   IPRATROPIUM-ALBUTEROL (DUONEB) 0.5-2.5 (3) MG/3ML SOLN    Take 3 mLs by nebulization 2 (two) times daily. And twice daily as needed   LORAZEPAM (ATIVAN) 0.5 MG TABLET    Take one tablet by mouth every night  at bedtime for anxiety/agiation   MULTIPLE VITAMINS-MINERALS (CENTRUM SILVER PO)    Take 1 tablet by mouth daily.     OMEPRAZOLE (PRILOSEC) 20 MG CAPSULE    Take 20 mg by mouth daily.     OXYBUTYNIN (DITROPAN-XL) 5 MG 24 HR TABLET    Take 5 mg by mouth daily.   PAROXETINE (PAXIL) 40 MG TABLET    Take 40 mg by mouth daily.   POTASSIUM CHLORIDE (K-DUR) 10 MEQ TABLET    Take 10 mEq by mouth daily.   SENNOSIDES-DOCUSATE SODIUM (SENOKOT-S) 8.6-50 MG TABLET    Take 2 tablets by mouth daily.  Modified Medications   No medications on file  Discontinued Medications   DICLOFENAC (VOLTAREN) 75 MG EC TABLET    Take 75 mg by mouth 2 (two) times daily.     DONEPEZIL (ARICEPT) 10 MG TABLET    Take 10 mg by mouth daily.   GLIPIZIDE (GLUCOTROL) 10 MG TABLET    Take 10 mg by mouth daily.     MEMANTINE (NAMENDA) 10 MG TABLET    Take 10 mg by  mouth 2 (two) times daily.   MIRTAZAPINE (REMERON) 7.5 MG TABLET    Take 1 tablet (7.5 mg total) by mouth at bedtime.   OMEGA-3 FATTY ACIDS (FISH OIL) 1200 MG CAPS    Take 1,200 mg by mouth daily.    SIMVASTATIN (ZOCOR) 40 MG TABLET    Take 40 mg by mouth at bedtime.     TOLTERODINE (DETROL) 2 MG TABLET    Take 2 mg by mouth 2 (two) times daily.    SIGNIFICANT DIAGNOSTIC EXAMS  12-29-12: chest x-ray: poor inspiration; minimal cardiomegaly no pulmonary vascular congestion or pleural effusion; interval resolution of patchy altelctasis or interstitial pneumonitis; has mild bronchitis   LABS REVIEWED:   12-07-12: u/a: neg 12-22-12: wbc 52; hgb 13.8; hct 44.4; mcv 94.1; plt 242; glucose 155; bun 18;creat 0.8; k+4.3;na++137 Liver normal albumin 3.1; chol 207; ldl 135; trig 213; hgb a1c 8.6    Review of Systems  Unable to perform ROS   Physical Exam  Constitutional: She appears well-developed and well-nourished.  Neck: Neck supple. No JVD present.  Cardiovascular: Normal rate and intact distal pulses.   Heart rate irregular   Respiratory: Effort normal. She has wheezes.   GI: Soft. Bowel sounds are normal. She exhibits no distension. There is no tenderness.  Musculoskeletal: She exhibits no edema.  Is able to move extremities   Neurological: She is alert.  Skin: Skin is warm and dry.       ASSESSMENT/ PLAN:  DIABETES MELLITUS She is presently stable will continue her lantus 20 units nightly; her hgb a1c is 8.6; she is followed by hospice care; may want to consider stopping this medication in the future.   Dementia She is off her dementia medications. She is followed by hospice care. Per the staff she has declined over the past months  URINARY INCONTINENCE Will stop her ditropan at this time; it is highly doubtful she is receiving benefit from this medication.   HYPERTENSION She is stable will conitnue atenolol 25 mg daily; hydralazine 25 mg every 8 hours asa 81 mg daily  and will monitor  Anxiety and depression Will continue paxil 40 mg daily and will continue ativan 0.5 mg nightly for anxiety and will monitor  GERD Will continue prilosec 20 mg daily   Unspecified constipation Will continue senna s 2 tabs nightly   Edema She is no peripheral edema present; will continue lasix 20 mg daily with k+ 10 meq daily; and will monitor   Wheezes She is worse; will continue her duoneb twice daily and every 6 hours as needed; will give her an extra dose of lasix 20 mg will begin her roxanol 20 mg/ml will give 5 mg every 2 hours as needed for pain or distress and will monitor her status     Time spent with patient 50 minutes.

## 2013-01-14 NOTE — Assessment & Plan Note (Signed)
She is off her dementia medications. She is followed by hospice care. Per the staff she has declined over the past months

## 2013-01-14 NOTE — Assessment & Plan Note (Signed)
Will stop her ditropan at this time; it is highly doubtful she is receiving benefit from this medication.

## 2013-01-14 NOTE — Assessment & Plan Note (Signed)
Will continue paxil 40 mg daily and will continue ativan 0.5 mg nightly for anxiety and will monitor

## 2013-01-14 NOTE — Assessment & Plan Note (Signed)
She is presently stable will continue her lantus 20 units nightly; her hgb a1c is 8.6; she is followed by hospice care; may want to consider stopping this medication in the future.

## 2013-01-14 NOTE — Assessment & Plan Note (Signed)
Will continue senna s 2 tabs nightly

## 2013-01-14 NOTE — Assessment & Plan Note (Signed)
Will continue prilosec 20 mg daily  

## 2013-01-14 NOTE — Progress Notes (Signed)
This encounter was created in error - please disregard.

## 2013-01-20 ENCOUNTER — Other Ambulatory Visit: Payer: Self-pay | Admitting: Geriatric Medicine

## 2013-01-25 ENCOUNTER — Non-Acute Institutional Stay (SKILLED_NURSING_FACILITY): Payer: Medicare Other | Admitting: Adult Health

## 2013-01-25 DIAGNOSIS — J019 Acute sinusitis, unspecified: Secondary | ICD-10-CM

## 2013-02-14 NOTE — Progress Notes (Signed)
This encounter was created in error - please disregard.

## 2013-02-15 ENCOUNTER — Non-Acute Institutional Stay (SKILLED_NURSING_FACILITY): Payer: Medicare Other | Admitting: Adult Health

## 2013-02-15 DIAGNOSIS — R609 Edema, unspecified: Secondary | ICD-10-CM

## 2013-02-15 DIAGNOSIS — R062 Wheezing: Secondary | ICD-10-CM

## 2013-02-15 DIAGNOSIS — F0390 Unspecified dementia without behavioral disturbance: Secondary | ICD-10-CM

## 2013-02-15 DIAGNOSIS — F341 Dysthymic disorder: Secondary | ICD-10-CM

## 2013-02-15 DIAGNOSIS — F329 Major depressive disorder, single episode, unspecified: Secondary | ICD-10-CM

## 2013-02-15 DIAGNOSIS — E119 Type 2 diabetes mellitus without complications: Secondary | ICD-10-CM

## 2013-02-15 DIAGNOSIS — K219 Gastro-esophageal reflux disease without esophagitis: Secondary | ICD-10-CM

## 2013-02-15 DIAGNOSIS — K5900 Constipation, unspecified: Secondary | ICD-10-CM

## 2013-02-15 DIAGNOSIS — I10 Essential (primary) hypertension: Secondary | ICD-10-CM

## 2013-03-07 ENCOUNTER — Encounter: Payer: Self-pay | Admitting: Adult Health

## 2013-03-07 DIAGNOSIS — J019 Acute sinusitis, unspecified: Secondary | ICD-10-CM | POA: Insufficient documentation

## 2013-03-07 NOTE — Progress Notes (Signed)
Patient ID: Debbie Rodriguez, female   DOB: 04-18-1925, 79 y.o.   MRN: [id number]  [facility]  Allergies  Allergen Reactions  . Penicillins     REACTION: Reaction not known  . Sulfonamide Derivatives      Chief Complaint  Patient presents with  . Acute Visit    sinusitis    HPI: I was being asked to see her for acute sinusitis. Staff reports that she is having green nasal drainage present. There are no reports of any fever present no reports of increased shortness of breath present. She is unable to fully participate in the hpi or ros. She is followed by hospice care.   Past Medical History  Diagnosis Date  . DM2 (diabetes mellitus, type 2)   . CAD (coronary artery disease)     cath- 2007  . Alzheimer's dementia   . HLD (hyperlipidemia)   . HTN (hypertension)   . A-fib   . Breast cancer     Past Surgical History  Procedure Laterality Date  . Replacement total knee    . Abdominal hysterectomy      VITAL SIGNS BP 118/69  Pulse 70  Temp(Src) 98.3 F (36.8 C)   Patient's Medications  New Prescriptions   No medications on file  Previous Medications   ACETAMINOPHEN (TYLENOL) 500 MG TABLET    Take 500 mg by mouth every 4 (four) hours as needed. For pain   ASPIRIN 81 MG EC TABLET    Take 81 mg by mouth daily.     ATENOLOL (TENORMIN) 25 MG TABLET    Take 25 mg by mouth daily.     BUSPIRONE (BUSPAR) 5 MG TABLET    Take 5 mg by mouth 2 (two) times daily.   FEEDING SUPPLEMENT (PRO-STAT SUGAR FREE 64) LIQD    Take 30 mLs by mouth 2 (two) times daily with a meal. 1 hour prior to meal.   FUROSEMIDE (LASIX) 20 MG TABLET    Take 20 mg by mouth daily.   HYDRALAZINE (APRESOLINE) 25 MG TABLET    Take 25 mg by mouth 3 (three) times daily.   INSULIN GLARGINE (LANTUS) 100 UNIT/ML INJECTION    Inject 20 Units into the skin at bedtime.   IPRATROPIUM-ALBUTEROL (DUONEB) 0.5-2.5 (3) MG/3ML SOLN    Take 3 mLs by nebulization 2 (two) times daily. And twice daily as needed   LORAZEPAM  (ATIVAN) 0.5 MG TABLET    Take one tablet by mouth every night at bedtime for anxiety/agiation   MORPHINE SULFATE (MORPHINE CONCENTRATE) 10 MG / 0.5 ML CONCENTRATED SOLUTION    Take 0.25 mLs (5 mg total) by mouth every 2 (two) hours as needed for pain.   MULTIPLE VITAMINS-MINERALS (CENTRUM SILVER PO)    Take 1 tablet by mouth daily.     OMEPRAZOLE (PRILOSEC) 20 MG CAPSULE    Take 20 mg by mouth daily.     OXYBUTYNIN (DITROPAN-XL) 5 MG 24 HR TABLET    Take 5 mg by mouth daily.   PAROXETINE (PAXIL) 40 MG TABLET    Take 40 mg by mouth daily.   POTASSIUM CHLORIDE (K-DUR) 10 MEQ TABLET    Take 10 mEq by mouth daily.   SENNOSIDES-DOCUSATE SODIUM (SENOKOT-S) 8.6-50 MG TABLET    Take 2 tablets by mouth daily.  Modified Medications   No medications on file  Discontinued Medications   No medications on file    SIGNIFICANT DIAGNOSTIC EXAMS    12-29-12: chest x-ray: poor inspiration; minimal cardiomegaly  no pulmonary vascular congestion or pleural effusion; interval resolution of patchy altelctasis or interstitial pneumonitis; has mild bronchitis   LABS REVIEWED:   12-07-12: u/a: neg 12-22-12: wbc 52; hgb 13.8; hct 44.4; mcv 94.1; plt 242; glucose 155; bun 18;creat 0.8; k+4.3;na++137 Liver normal albumin 3.1; chol 207; ldl 135; trig 213; hgb a1c 8.6    Review of Systems  Unable to perform ROS   Physical Exam  Constitutional: She appears well-developed and well-nourished.  HENT:  Has facial tenderness present to maxillary sinuses; has green nasal drainage  Neck: Neck supple. No JVD present.  Cardiovascular: Normal rate, regular rhythm and intact distal pulses.   Respiratory: Effort normal and breath sounds normal. No respiratory distress.  GI: Soft. Bowel sounds are normal. She exhibits no distension. There is no tenderness.  Neurological: She is alert.  Skin: Skin is dry.       ASSESSMENT/ PLAN:  Acute sinusitis Will being her on zithromax 500 mg daiyl for 7 day. Will  Continue her  her duonebs as ordered and will continue to monitor her status

## 2013-03-07 NOTE — Assessment & Plan Note (Signed)
Will being her on zithromax 500 mg daiyl for 7 day. Will  Continue her her duonebs as ordered and will continue to monitor her status

## 2013-03-08 ENCOUNTER — Non-Acute Institutional Stay (SKILLED_NURSING_FACILITY): Payer: Medicare Other | Admitting: Nurse Practitioner

## 2013-03-08 DIAGNOSIS — F0390 Unspecified dementia without behavioral disturbance: Secondary | ICD-10-CM

## 2013-03-08 DIAGNOSIS — J189 Pneumonia, unspecified organism: Secondary | ICD-10-CM

## 2013-03-08 DIAGNOSIS — R062 Wheezing: Secondary | ICD-10-CM

## 2013-03-08 MED ORDER — INSULIN GLARGINE 100 UNIT/ML SOLOSTAR PEN: 10.0000 [IU] | PEN_INJECTOR | Freq: Every day | SUBCUTANEOUS | Status: DC

## 2013-03-08 NOTE — Progress Notes (Signed)
Patient ID: Debbie Rodriguez, female   DOB: 1925-04-06, 79 y.o.   MRN: [id number]  [facility]  Allergies  Allergen Reactions  . Penicillins     REACTION: Reaction not known  . Sulfonamide Derivatives      Chief Complaint  Patient presents with  . Medical Managment of Chronic Issues    HPI: She is being seen for the management of her chronic illnesses. She continues to be followed by hospice care. Her lantus had been stopped; however; she cbg's have continued to be elevated and she will require her lantus to be restarted.   Past Medical History  Diagnosis Date  . DM2 (diabetes mellitus, type 2)   . CAD (coronary artery disease)     cath- 2007  . Alzheimer's dementia   . HLD (hyperlipidemia)   . HTN (hypertension)   . A-fib   . Breast cancer     Past Surgical History  Procedure Laterality Date  . Replacement total knee    . Abdominal hysterectomy      VITAL SIGNS BP 108/56  Pulse 65   Patient's Medications  New Prescriptions   No medications on file  Previous Medications   ATENOLOL (TENORMIN) 25 MG TABLET    Take 25 mg by mouth daily.     BUSPIRONE (BUSPAR) 5 MG TABLET    Take 5 mg by mouth 2 (two) times daily.   FUROSEMIDE (LASIX) 20 MG TABLET    Take 20 mg by mouth daily.   IPRATROPIUM-ALBUTEROL (DUONEB) 0.5-2.5 (3) MG/3ML SOLN    Take 3 mLs by nebulization every 6 (six) hours. And twice daily as needed   LORAZEPAM (ATIVAN) 0.5 MG TABLET    Take one tablet by mouth every night at bedtime for anxiety/agiation   OMEPRAZOLE (PRILOSEC) 20 MG CAPSULE    Take 20 mg by mouth daily.     PAROXETINE (PAXIL) 40 MG TABLET    Take 40 mg by mouth daily.   POTASSIUM CHLORIDE (K-DUR) 10 MEQ TABLET    Take 10 mEq by mouth daily.   SENNOSIDES-DOCUSATE SODIUM (SENOKOT-S) 8.6-50 MG TABLET    Take 2 tablets by mouth daily.  Modified Medications   Modified Medication Previous Medication   MORPHINE SULFATE (MORPHINE CONCENTRATE) 10 MG / 0.5 ML CONCENTRATED SOLUTION Morphine Sulfate  (MORPHINE CONCENTRATE) 10 mg / 0.5 ml concentrated solution      Take 5 mg by mouth every 2 (two) hours as needed for pain. TAKE 5 MG EVERY 8 HOURS ROUTINELY AND EVERY 2 HOURS AS NEEDED    Take 0.25 mLs (5 mg total) by mouth every 2 (two) hours as needed for pain.  Discontinued Medications   ACETAMINOPHEN (TYLENOL) 500 MG TABLET    Take 500 mg by mouth every 4 (four) hours as needed. For pain   ASPIRIN 81 MG EC TABLET    Take 81 mg by mouth daily.     FEEDING SUPPLEMENT (PRO-STAT SUGAR FREE 64) LIQD    Take 30 mLs by mouth 2 (two) times daily with a meal. 1 hour prior to meal.   HYDRALAZINE (APRESOLINE) 25 MG TABLET    Take 25 mg by mouth 3 (three) times daily.   INSULIN GLARGINE (LANTUS) 100 UNIT/ML INJECTION    Inject 20 Units into the skin at bedtime.   MULTIPLE VITAMINS-MINERALS (CENTRUM SILVER PO)    Take 1 tablet by mouth daily.     OXYBUTYNIN (DITROPAN-XL) 5 MG 24 HR TABLET    Take 5 mg by mouth  daily.    SIGNIFICANT DIAGNOSTIC EXAMS   12-29-12: chest x-ray: poor inspiration; minimal cardiomegaly no pulmonary vascular congestion or pleural effusion; interval resolution of patchy altelctasis or interstitial pneumonitis; has mild bronchitis   LABS REVIEWED:   12-07-12: u/a: neg 12-22-12: wbc 52; hgb 13.8; hct 44.4; mcv 94.1; plt 242; glucose 155; bun 18;creat 0.8; k+4.3;na++137 Liver normal albumin 3.1; chol 207; ldl 135; trig 213; hgb a1c 8.6    Review of Systems  Unable to perform ROS   Physical Exam  Constitutional: She appears well-developed and well-nourished.  Neck: Neck supple. No JVD present.  Cardiovascular: Normal rate, regular rhythm and intact distal pulses.   Respiratory: Effort normal and breath sounds normal. No respiratory distress.  GI: Soft. Bowel sounds are normal. She exhibits no distension. There is no tenderness.  Neurological: She is alert.  Skin: Skin is dry.      ASSESSMENT/ PLAN:  HYPERTENSION Will continue her tenormin 25 mg daily and will monitor  her status   GERD Will continue her prilosec 20 mg daily   Unspecified constipation Will continue her senna s 2 tabs daily   DIABETES MELLITUS She did not tolerate coming off the lantus will restart her lantus at 10 units nightly and will monitor her status   Edema She is stable will continue her lasix 20 mg daily with k+ 10 meq daily   Anxiety and depression She is stable will continue paxil 40 mg daily and buspar 5 mg twice daily and will monitor her status   Wheezes She is stable will continue her duoneb every 6 hours routinely and will monitor her status   Dementia There is no significant change in her status; she is presently not on medications will not make changes at this time and will monitor her status she continues to be followed by hospice care.

## 2013-03-08 NOTE — Assessment & Plan Note (Signed)
She is stable will continue her duoneb every 6 hours routinely and will monitor her status

## 2013-03-08 NOTE — Assessment & Plan Note (Signed)
There is no significant change in her status; she is presently not on medications will not make changes at this time and will monitor her status she continues to be followed by hospice care.

## 2013-03-08 NOTE — Assessment & Plan Note (Signed)
Will continue her prilosec 20 mg daily

## 2013-03-08 NOTE — Assessment & Plan Note (Signed)
She is stable will continue paxil 40 mg daily and buspar 5 mg twice daily and will monitor her status

## 2013-03-08 NOTE — Assessment & Plan Note (Signed)
She did not tolerate coming off the lantus will restart her lantus at 10 units nightly and will monitor her status

## 2013-03-08 NOTE — Assessment & Plan Note (Signed)
Will continue her senna s 2 tabs daily

## 2013-03-08 NOTE — Assessment & Plan Note (Signed)
She is stable will continue her lasix 20 mg daily with k+ 10 meq daily

## 2013-03-08 NOTE — Assessment & Plan Note (Signed)
Will continue her tenormin 25 mg daily and will monitor her status

## 2013-03-16 ENCOUNTER — Non-Acute Institutional Stay (SKILLED_NURSING_FACILITY): Payer: Medicare Other | Admitting: Nurse Practitioner

## 2013-03-16 DIAGNOSIS — F0280 Dementia in other diseases classified elsewhere without behavioral disturbance: Secondary | ICD-10-CM

## 2013-03-16 DIAGNOSIS — K5900 Constipation, unspecified: Secondary | ICD-10-CM

## 2013-03-16 DIAGNOSIS — F0390 Unspecified dementia without behavioral disturbance: Secondary | ICD-10-CM

## 2013-03-16 DIAGNOSIS — R443 Hallucinations, unspecified: Secondary | ICD-10-CM

## 2013-03-16 DIAGNOSIS — R062 Wheezing: Secondary | ICD-10-CM

## 2013-03-16 DIAGNOSIS — R5381 Other malaise: Secondary | ICD-10-CM

## 2013-04-08 ENCOUNTER — Other Ambulatory Visit: Payer: Self-pay | Admitting: *Deleted

## 2013-04-08 MED ORDER — MORPHINE SULFATE (CONCENTRATE) 20 MG/ML PO SOLN: ORAL | Status: DC

## 2013-04-11 ENCOUNTER — Other Ambulatory Visit: Payer: Self-pay | Admitting: *Deleted

## 2013-04-11 MED ORDER — LORAZEPAM 0.5 MG PO TABS: ORAL_TABLET | ORAL | Status: DC

## 2013-04-11 NOTE — Telephone Encounter (Signed)
rx refilled per protocol  

## 2013-05-06 ENCOUNTER — Other Ambulatory Visit: Payer: Self-pay | Admitting: *Deleted

## 2013-05-06 MED ORDER — LORAZEPAM 0.5 MG PO TABS: ORAL_TABLET | ORAL | Status: DC

## 2013-06-13 NOTE — Progress Notes (Signed)
Patient ID: Debbie Rodriguez, female   DOB: Mar 30, 1925, 79 y.o.   MRN: [id number]  CODE STATUS: DO NOT RESUSCITATE  Allergies  Allergen Reactions  . Penicillins     REACTION: Reaction not known  . Sulfonamide Derivatives     Current Outpatient Prescriptions on File Prior to Visit  Medication Sig Dispense Refill  . atenolol (TENORMIN) 25 MG tablet Take 25 mg by mouth daily.        . busPIRone (BUSPAR) 5 MG tablet Take 5 mg by mouth 2 (two) times daily.      . furosemide (LASIX) 20 MG tablet Take 20 mg by mouth daily.      . Insulin Glargine (LANTUS SOLOSTAR) 100 UNIT/ML SOPN Inject 10 Units into the skin at bedtime.  5 pen  PRN  . ipratropium-albuterol (DUONEB) 0.5-2.5 (3) MG/3ML SOLN Take 3 mLs by nebulization every 6 (six) hours. And twice daily as needed      . Morphine Sulfate (MORPHINE CONCENTRATE) 10 mg / 0.5 ml concentrated solution Take 5 mg by mouth every 2 (two) hours as needed for pain. TAKE 5 MG EVERY 8 HOURS ROUTINELY AND EVERY 2 HOURS AS NEEDED      . omeprazole (PRILOSEC) 20 MG capsule Take 20 mg by mouth daily.        Marland Kitchen PARoxetine (PAXIL) 40 MG tablet Take 40 mg by mouth daily.      . potassium chloride (K-DUR) 10 MEQ tablet Take 10 mEq by mouth daily.      . sennosides-docusate sodium (SENOKOT-S) 8.6-50 MG tablet Take 2 tablets by mouth daily.       No current facility-administered medications on file prior to visit.    Past Medical History  Diagnosis Date  . DM2 (diabetes mellitus, type 2)   . CAD (coronary artery disease)     cath- 2007  . Alzheimer's dementia   . HLD (hyperlipidemia)   . HTN (hypertension)   . A-fib   . Breast cancer    Past Surgical History  Procedure Laterality Date  . Replacement total knee    . Abdominal hysterectomy      Radiological study from 03/07/2013:  Minimal cardiomegaly unchanged without pulmonary vascular congestion or pleural effusion Mild bronchitis at the Perihilar regions improved Patchy left basilar or  atelectasis or pneumonitis appears new.  Most recent lab work from 12/22/2012 WBC 5.2 RBC 4.7 Hemoglobin 13.8 Hematocrit 44.4 MCV 94.1 MCH 29.2 MCHC 31.1 RDW 15 point Platelets 242  Sodium 137 Potassium 4.3 Chloride 103 CO2 29 AGAP 5 Glucose 155 BUN 18 Creatinine 0.8 BUN/CR 23.9 Calcium 9.7 ALB/GLOB 1 Total bilirubin 0.7 ALP 99 AST 12 ALT 9  Cholesterol 207 HDL 29 LDL 135 VLDL 42.7 Triglycerides 07/26/2011 CH/HDL 7.1  BP 128/67  Pulse 409  Resp 24  Review of Systems  Constitutional: Negative for fever, chills and diaphoresis.  HENT: Negative for ear discharge and nosebleeds.   Eyes: Negative for discharge and redness.  Respiratory: Negative for hemoptysis and sputum production.   Cardiovascular: Negative for palpitations and claudication.  Gastrointestinal: Negative for blood in stool and melena.  Genitourinary: Negative for hematuria.  Skin: Negative for itching and rash.  Neurological: Positive for weakness. Negative for seizures and loss of consciousness.  Psychiatric/Behavioral: Positive for hallucinations. Negative for suicidal ideas.       Patient has been trying to crawl out of bed   Physical Exam  Constitutional: She appears well-developed and well-nourished.  HENT:  Head: Normocephalic and  atraumatic.  Right Ear: External ear normal.  Left Ear: External ear normal.  Nose: Nose normal.  Mouth/Throat: Oropharynx is clear and moist.  Eyes: Conjunctivae and EOM are normal. Pupils are equal, round, and reactive to light. Right eye exhibits no discharge. Left eye exhibits no discharge. No scleral icterus.  Neck: No JVD present. No thyromegaly present.  Cardiovascular:  Apical pulse is intermittently irregular  Pulmonary/Chest: No stridor. She has wheezes.  Abdominal: There is no rebound and no guarding.  Musculoskeletal: She exhibits edema.  Trace to +1 edema in the well below the knee.  Lymphadenopathy:    She has no cervical adenopathy.    Neurological: She is alert.  Oriented to self only  Skin: Skin is warm and dry. No rash noted. No erythema. No pallor.  Psychiatric: She has a normal mood and affect.   Assessment/Plan:   New presentation of hallucinations, will increase Ativan to 0.5 mg twice a day.  Will also allow a as needed dose of 0.5 mg Ativan twice a day, mid afternoon and during the night.  This change in treatment plan has been discussed with the Hospice Nurse we'll continue the BuSpar  Also recommend that further lipid profile testing be discontinued.  Last radiological study, antibiotic was changed to Levaquin 500 mg per day x 7 days, r/t lack of response to Zithromax. Will continue to monitor respiratory status  Diabetes, Lantus has been decreased to 10 units at bedtime. Concern with any episode of hypoglycemia that it would not be recognized.  Will assess A1C at next blood draw  GERD, continue omeprazole 20 mg a day.  Will continue to monitor  Depression, will continue the Paxil 40 mg per day.  Will continue to monitor  Hypertension, Hydralazine  25 mg 3 times a day will 25 mg a day, aspirin 81 mg a day. We will continue to monitor.  Will continue to monitor   Edema, will continue the Lasix 20 mg per day and potassium replacement 10 mEq per day, will continue to monitor  Constipation, will continue Senokot 2 tablets p.m.  Will continue to monitor  Intermittent wheezing, patient really needs to continue her DuoNeb twice a day, and every 6 hours as needed.  Will continue to monitor pt's response  Discomfort/pain continue the Roxanol, 20 mg per mL, dose equals 0.25 ml or 5 mg every 2 hours as needed. Acetaminophen also remains available, at 500 mg every 4 hours. We'll continue to monitor

## 2013-06-20 ENCOUNTER — Other Ambulatory Visit: Payer: Self-pay | Admitting: *Deleted

## 2013-06-20 MED ORDER — MORPHINE SULFATE (CONCENTRATE) 20 MG/ML PO SOLN: ORAL | Status: DC

## 2013-07-08 ENCOUNTER — Non-Acute Institutional Stay (SKILLED_NURSING_FACILITY): Payer: Medicare Other | Admitting: Adult Health

## 2013-07-08 DIAGNOSIS — I10 Essential (primary) hypertension: Secondary | ICD-10-CM

## 2013-07-08 DIAGNOSIS — IMO0001 Reserved for inherently not codable concepts without codable children: Secondary | ICD-10-CM

## 2013-07-08 DIAGNOSIS — F32A Depression, unspecified: Secondary | ICD-10-CM

## 2013-07-08 DIAGNOSIS — F0390 Unspecified dementia without behavioral disturbance: Secondary | ICD-10-CM

## 2013-07-08 DIAGNOSIS — F329 Major depressive disorder, single episode, unspecified: Secondary | ICD-10-CM

## 2013-07-08 DIAGNOSIS — R062 Wheezing: Secondary | ICD-10-CM

## 2013-07-08 DIAGNOSIS — F419 Anxiety disorder, unspecified: Secondary | ICD-10-CM

## 2013-07-08 DIAGNOSIS — E1165 Type 2 diabetes mellitus with hyperglycemia: Secondary | ICD-10-CM

## 2013-07-08 DIAGNOSIS — K5900 Constipation, unspecified: Secondary | ICD-10-CM

## 2013-07-08 DIAGNOSIS — F341 Dysthymic disorder: Secondary | ICD-10-CM

## 2013-07-08 DIAGNOSIS — R609 Edema, unspecified: Secondary | ICD-10-CM

## 2013-07-08 NOTE — Progress Notes (Signed)
03/08/2013 [facility] CODE STATUS, DNR   Patient ID: Hervey Ard, female   DOB: 1924/06/26, 80 y.o.   MRN: [id number]  Allergies  Allergen Reactions  . Penicillins     REACTION: Reaction not known  . Sulfonamide Derivatives    Chief Complaint  Patient presents with  . Acute Visit    History of Present Illness:  Concern verbalized by pt's daughter r/t her mother being treated with Zithromax for pneumonitis.  State last episode of Pneumonitis, zithromax was not successful and her mother required an additional 10 days of Avelox.  Past Medical History  Diagnosis Date  . DM2 (diabetes mellitus, type 2)   . CAD (coronary artery disease)     cath- 2007  . Alzheimer's dementia   . HLD (hyperlipidemia)   . HTN (hypertension)   . A-fib   . Breast cancer    Past Surgical History  Procedure Laterality Date  . Replacement total knee    . Abdominal hysterectomy     Current Outpatient Prescriptions on File Prior to Visit  Medication Sig Dispense Refill  . atenolol (TENORMIN) 25 MG tablet Take 25 mg by mouth daily.        . busPIRone (BUSPAR) 5 MG tablet Take 5 mg by mouth 2 (two) times daily.      . furosemide (LASIX) 20 MG tablet Take 20 mg by mouth daily.      . Insulin Glargine (LANTUS SOLOSTAR) 100 UNIT/ML SOPN Inject 10 Units into the skin at bedtime.  5 pen  PRN  . ipratropium-albuterol (DUONEB) 0.5-2.5 (3) MG/3ML SOLN Take 3 mLs by nebulization every 6 (six) hours. And twice daily as needed      . Morphine Sulfate (MORPHINE CONCENTRATE) 10 mg / 0.5 ml concentrated solution Take 5 mg by mouth every 2 (two) hours as needed for pain. TAKE 5 MG EVERY 8 HOURS ROUTINELY AND EVERY 2 HOURS AS NEEDED      . omeprazole (PRILOSEC) 20 MG capsule Take 20 mg by mouth daily.        Marland Kitchen PARoxetine (PAXIL) 40 MG tablet Take 40 mg by mouth daily.      . potassium chloride (K-DUR) 10 MEQ tablet Take 10 mEq by mouth daily.      . sennosides-docusate sodium (SENOKOT-S) 8.6-50 MG tablet  Take 2 tablets by mouth daily.       No current facility-administered medications on file prior to visit.   Recent Labs:  WBC 5.2 RBC 4.7 HGB 13.8 HCt 44.4 MCV 94.1 MCH 20.21 MCHC 31.1 RDW 15.9 PLT 242  NA 137 K 4.3 Cl 103 CO2 29 AGAP 5 GLUC 155 BUN 18 Cr 0.8 CA 9.7 T PRO 6.1 ALB 3.1 ALP 99 GLOb 3 T BIL 0.7 ALP 99 AST 12 ALT 9  Vital signs  120/70 74 97.9 RR 20  Physical Exam  Pt sleeping, but easily arousable.  Does not know the day of the week, or where she is .  Pleasant, in no apparent distress. Has spit tobacco at bedside, snuff.Unable tot answer simple questions, but can take a deep breath when asked.    Head is normocephalic, atraumatic  Eyes, EOMI, Perrla, + 'ive red reflex.  Positive for arcus senilsis   TM's ok  OP, pt's own teeth are present, but noted to be pocketing food in her buccal pouch, when asked, pt states she has a mouthful of snuff.    Neck, not cervical adenopathy, no thyromegaly, no apparent carotid  bruits.    Apical Pulse intermittently irregular  BBrS, with scattered crackles throughout, R>L.  No respiratory distress.  Abdomen, soft and undistended.  Positive bowel sounds, no tenderness to palpation  BLE's  Warm, with palpable PT pulses.    Assessment/Plan:  Will obtain a speech consult, to assess r/t to pocketing of food and determining if pt is aspirating.    Will d/c the Zithromax and begin the Levaquin at 500 mg per day.  X 10 days, if x-ray results are negative may d/c the antibiotic    Will remind staff to implement the ordered Duoneb nebulizer solution if theynotice any cough or SOB or wheezing.  Reminded that pt will not think to ask.  For edema and heart dx, will continue Lasix at 20 mg per day and supplement KCl at 10 meq  Per day. Will also continue tenormin 25 mg each day.    DM, continue Lantus, 100units per Ml, 10 units sub cut each pm.    Depression and behavior modification, continue Buspar 5 mg twice a  day, Paxil 40 each day  Jerrye Bushy, continue prilosec 20 mg each day  Pain, continue morphine solution 20 mg/ml, 5 mgevery 8 hours and every 2 hours as needed.  Observe pt for signs of pain, as she is not likely to ask.    Constipation, continue Senna - S, may hold if stools become too loose   Have discussed these issues with pt's daughter, and she is in agreement.

## 2013-07-09 ENCOUNTER — Encounter: Payer: Self-pay | Admitting: Adult Health

## 2013-07-09 DIAGNOSIS — E1165 Type 2 diabetes mellitus with hyperglycemia: Secondary | ICD-10-CM

## 2013-07-09 DIAGNOSIS — F0390 Unspecified dementia without behavioral disturbance: Secondary | ICD-10-CM | POA: Insufficient documentation

## 2013-07-09 DIAGNOSIS — IMO0001 Reserved for inherently not codable concepts without codable children: Secondary | ICD-10-CM | POA: Insufficient documentation

## 2013-07-09 NOTE — Progress Notes (Signed)
Patient ID: Debbie Rodriguez, female   DOB: 1924/12/09, 80 y.o.   MRN: [id number]     [facility]  Allergies  Allergen Reactions  . Penicillins     REACTION: Reaction not known  . Sulfonamide Derivatives      Chief Complaint  Patient presents with  . Medical Managment of Chronic Issues    HPI:  She is being seen for the management of her chronic illnesses. She is followed by hospice care. There is no significant change in her status in the recent past. She cannot fully participate in the hpi or ros. There are no concerns being voiced by the nursing staff at this time.   Past Medical History  Diagnosis Date  . DM2 (diabetes mellitus, type 2)   . CAD (coronary artery disease)     cath- 2007  . Alzheimer's dementia   . HLD (hyperlipidemia)   . HTN (hypertension)   . A-fib   . Breast cancer     Past Surgical History  Procedure Laterality Date  . Replacement total knee    . Abdominal hysterectomy      VITAL SIGNS BP 126/58  Pulse 70  Ht 5' (1.524 m)  Wt 193 lb 9.6 oz (87.816 kg)  BMI 37.81 kg/m2   Patient's Medications  New Prescriptions   No medications on file  Previous Medications   ACETAMINOPHEN (TYLENOL) 325 MG TABLET    Take 650 mg by mouth 2 (two) times daily.   ATENOLOL (TENORMIN) 25 MG TABLET    Take 25 mg by mouth daily.     BUSPIRONE (BUSPAR) 5 MG TABLET    Take 5 mg by mouth 2 (two) times daily.   FUROSEMIDE (LASIX) 20 MG TABLET    Take 20 mg by mouth daily.   INSULIN GLARGINE (LANTUS SOLOSTAR) 100 UNIT/ML SOPN    Inject 10 Units into the skin at bedtime.   IPRATROPIUM (ATROVENT) 0.03 % NASAL SPRAY    Place 2 sprays into both nostrils 2 (two) times daily.   IPRATROPIUM-ALBUTEROL (DUONEB) 0.5-2.5 (3) MG/3ML SOLN    Take 3 mLs by nebulization every 4 (four) hours as needed. And twice daily as needed   LORAZEPAM (ATIVAN) 0.5 MG TABLET    Take one tablet by mouth/under tongue every night at bedtime; May have one additional tablet during daytime and one  additional tablet overnight as needed for restlessness/agitation   MORPHINE (ROXANOL) 20 MG/ML CONCENTRATED SOLUTION    Give 0.38ml every two hours as needed for pain or distress; Give 0.90ml by mouth every eight hours   POTASSIUM CHLORIDE (K-DUR) 10 MEQ TABLET    Take 10 mEq by mouth daily.   SENNOSIDES-DOCUSATE SODIUM (SENOKOT-S) 8.6-50 MG TABLET    Take 2 tablets by mouth daily.  Modified Medications   No medications on file  Discontinued Medications   MORPHINE SULFATE (MORPHINE CONCENTRATE) 10 MG / 0.5 ML CONCENTRATED SOLUTION    Take 5 mg by mouth every 2 (two) hours as needed for pain. TAKE 5 MG EVERY 8 HOURS ROUTINELY AND EVERY 2 HOURS AS NEEDED   OMEPRAZOLE (PRILOSEC) 20 MG CAPSULE    Take 20 mg by mouth daily.     PAROXETINE (PAXIL) 40 MG TABLET    Take 40 mg by mouth daily.    SIGNIFICANT DIAGNOSTIC EXAMS   12-29-12: chest x-ray: poor inspiration; minimal cardiomegaly no pulmonary vascular congestion or pleural effusion; interval resolution of patchy altelctasis or interstitial pneumonitis; has mild bronchitis   LABS REVIEWED:  12-07-12: u/a: neg 12-22-12: wbc 52; hgb 13.8; hct 44.4; mcv 94.1; plt 242; glucose 155; bun 18;creat 0.8; k+4.3;na++137 Liver normal albumin 3.1; chol 207; ldl 135; trig 213; hgb a1c 8.6   03-24-13: hgb a1c 7.3 06-24-13: wbc 4.7; hgb 14.3; hct 45.7; mcv 96.8 plt 217; glucose 138; bun 17; creat 0.7; k+4.6; na++135; liver normal albumin 3.2; chol 248; ldl 179 trig 176; hgb a1c 6.6      Review of Systems  Unable to perform ROS   Physical Exam  Constitutional: She appears well-developed and well-nourished.  Neck: Neck supple. No JVD present.  Cardiovascular: Normal rate, regular rhythm and intact distal pulses.   Respiratory: Effort normal and breath sounds normal. No respiratory distress.  GI: Soft. Bowel sounds are normal. She exhibits no distension. There is no tenderness.  Neurological: She is alert.  Skin: Skin is dry.     ASSESSMENT/  PLAN:  1. Hypertension: she is stable will continue tenormin 25 mg daily and will monitor her status  2. Edema: is stable will continue lasix 20 mg daily with k+ 10 meq daily and will monitor   3. Constipation; will continue senna s 2 tabs daily   4. Diabetes: her hgb a1c is 6.6; will stop her lantus at this time and will monitor her status   5. Chronic pain: there are no indications of pain present will continue roxanol 5 mg every 8 hours and every 2 hours as needed and tylenol 65 mg twice daily and will monitor   6. Anxiety and depression; she is stable at this time; will continue her buspar 5 mg twice daily and will continue her ativan 0.5 mg nightly and daily as needed and will monitor   7. Wheezes; will continue duoneb every 4 hours as needed and atrovent nasal spray twice daily   8. Dementia: she is end stage: on no medications at this time; will not make changes will monitor her status

## 2013-08-08 ENCOUNTER — Non-Acute Institutional Stay (SKILLED_NURSING_FACILITY): Payer: Medicare Other | Admitting: Adult Health

## 2013-08-08 ENCOUNTER — Encounter: Payer: Self-pay | Admitting: Adult Health

## 2013-08-08 DIAGNOSIS — K5900 Constipation, unspecified: Secondary | ICD-10-CM

## 2013-08-08 DIAGNOSIS — R062 Wheezing: Secondary | ICD-10-CM

## 2013-08-08 DIAGNOSIS — K219 Gastro-esophageal reflux disease without esophagitis: Secondary | ICD-10-CM

## 2013-08-08 DIAGNOSIS — F0390 Unspecified dementia without behavioral disturbance: Secondary | ICD-10-CM

## 2013-08-08 DIAGNOSIS — F341 Dysthymic disorder: Secondary | ICD-10-CM

## 2013-08-08 DIAGNOSIS — IMO0001 Reserved for inherently not codable concepts without codable children: Secondary | ICD-10-CM

## 2013-08-08 DIAGNOSIS — E1165 Type 2 diabetes mellitus with hyperglycemia: Secondary | ICD-10-CM

## 2013-08-08 DIAGNOSIS — F419 Anxiety disorder, unspecified: Secondary | ICD-10-CM

## 2013-08-08 DIAGNOSIS — I10 Essential (primary) hypertension: Secondary | ICD-10-CM

## 2013-08-08 DIAGNOSIS — F329 Major depressive disorder, single episode, unspecified: Secondary | ICD-10-CM

## 2013-08-08 DIAGNOSIS — F32A Depression, unspecified: Secondary | ICD-10-CM

## 2013-08-08 DIAGNOSIS — G8929 Other chronic pain: Secondary | ICD-10-CM

## 2013-08-08 NOTE — Progress Notes (Signed)
Patient ID: Debbie Rodriguez, female   DOB: 26-Nov-1924, 80 y.o.   MRN: [id number]     [facility]  Allergies  Allergen Reactions  . Penicillins     REACTION: Reaction not known  . Sulfonamide Derivatives      Chief Complaint  Patient presents with  . Medical Managment of Chronic Issues    HPI:  She is being seen for the management of her chronic illnesses. There has been no significant change in her overall status in the past month. Today she is complaining of heart burn. She is unable to fully participate in the hpi or ros. There are no concerns being voiced by the nursing staff at this time. She continues to be followed by hospice care.    Past Medical History  Diagnosis Date  . DM2 (diabetes mellitus, type 2)   . CAD (coronary artery disease)     cath- 2007  . Alzheimer's dementia   . HLD (hyperlipidemia)   . HTN (hypertension)   . A-fib   . Breast cancer     Past Surgical History  Procedure Laterality Date  . Replacement total knee    . Abdominal hysterectomy      VITAL SIGNS BP 117/65  Pulse 68  Ht 5' (1.524 m)  Wt 194 lb 1.6 oz (88.043 kg)  BMI 37.91 kg/m2   Patient's Medications  New Prescriptions   No medications on file  Previous Medications   ACETAMINOPHEN (TYLENOL) 325 MG TABLET    Take 650 mg by mouth 2 (two) times daily.   ATENOLOL (TENORMIN) 25 MG TABLET    Take 25 mg by mouth daily.     BUSPIRONE (BUSPAR) 5 MG TABLET    Take 5 mg by mouth 2 (two) times daily.   FUROSEMIDE (LASIX) 20 MG TABLET    Take 20 mg by mouth daily.   IPRATROPIUM (ATROVENT) 0.03 % NASAL SPRAY    Place 2 sprays into both nostrils 2 (two) times daily.   IPRATROPIUM-ALBUTEROL (DUONEB) 0.5-2.5 (3) MG/3ML SOLN    Take 3 mLs by nebulization every 4 (four) hours as needed. And twice daily as needed   LORAZEPAM (ATIVAN) 0.5 MG TABLET    Take one tablet by mouth/under tongue every night at bedtime; May have one additional tablet during daytime and one additional tablet overnight  as needed for restlessness/agitation   MORPHINE (ROXANOL) 20 MG/ML CONCENTRATED SOLUTION    Give 0.55ml every two hours as needed for pain or distress; Give 0.33ml by mouth every eight hours   POTASSIUM CHLORIDE (K-DUR) 10 MEQ TABLET    Take 10 mEq by mouth daily.   SENNOSIDES-DOCUSATE SODIUM (SENOKOT-S) 8.6-50 MG TABLET    Take 2 tablets by mouth daily.  Modified Medications   No medications on file  Discontinued Medications   INSULIN GLARGINE (LANTUS SOLOSTAR) 100 UNIT/ML SOPN    Inject 10 Units into the skin at bedtime.    SIGNIFICANT DIAGNOSTIC EXAMS   12-29-12: chest x-ray: poor inspiration; minimal cardiomegaly no pulmonary vascular congestion or pleural effusion; interval resolution of patchy altelctasis or interstitial pneumonitis; has mild bronchitis   LABS REVIEWED:   12-07-12: u/a: neg 12-22-12: wbc 52; hgb 13.8; hct 44.4; mcv 94.1; plt 242; glucose 155; bun 18;creat 0.8; k+4.3;na++137 Liver normal albumin 3.1; chol 207; ldl 135; trig 213; hgb a1c 8.6   03-24-13: hgb a1c 7.3 06-24-13: wbc 4.7; hgb 14.3; hct 45.7; mcv 96.8 plt 217; glucose 138; bun 17; creat 0.7; k+4.6; na++135; liver normal albumin  3.2; chol 248; ldl 179 trig 176; hgb a1c 6.6      Review of Systems  Unable to perform ROS   Physical Exam  Constitutional: She appears well-developed and well-nourished.  Neck: Neck supple. No JVD present.  Cardiovascular: Normal rate, regular rhythm and intact distal pulses.   Respiratory: Effort normal and breath sounds normal. No respiratory distress.  GI: Soft. Bowel sounds are normal. She exhibits no distension. There is no tenderness.  MS; Is able to move all extremities  Neurological: She is alert.  Skin: Skin is dry.     ASSESSMENT/ PLAN:  1. Hypertension: she is stable will continue tenormin 25 mg daily and will monitor her status  2. Edema: is stable will continue lasix 20 mg daily with k+ 10 meq daily and will monitor   3. Constipation; will continue senna s  2 tabs daily   4. Diabetes: her hgb a1c is 6.6; will not make changes and will monitor her status.   5. Chronic pain: there are no indications of pain present will continue roxanol 5 mg every 8 hours and every 2 hours as needed and tylenol 65 mg twice daily and will monitor   6. Anxiety and depression; she is stable at this time; will continue her buspar 5 mg twice daily and will continue her ativan 0.5 mg nightly and daily as needed and will monitor   7. Wheezes; will continue duoneb every 4 hours as needed and atrovent nasal spray twice daily   8. Dementia: she is end stage: on no medications at this time; will not make changes will monitor her status    9. GERD:  Will begin zantac 150 mg daily and will monitor her status.      Ok Edwards NP [facility]  Contact [phone] Monday through Friday 8am- 5pm  After hours call [phone]

## 2013-08-09 DIAGNOSIS — G8929 Other chronic pain: Secondary | ICD-10-CM | POA: Insufficient documentation

## 2013-09-05 ENCOUNTER — Encounter: Payer: Self-pay | Admitting: Adult Health

## 2013-09-05 ENCOUNTER — Non-Acute Institutional Stay (SKILLED_NURSING_FACILITY): Payer: Medicare Other | Admitting: Adult Health

## 2013-09-05 DIAGNOSIS — F32A Depression, unspecified: Secondary | ICD-10-CM

## 2013-09-05 DIAGNOSIS — F329 Major depressive disorder, single episode, unspecified: Secondary | ICD-10-CM

## 2013-09-05 DIAGNOSIS — F419 Anxiety disorder, unspecified: Secondary | ICD-10-CM

## 2013-09-05 DIAGNOSIS — I10 Essential (primary) hypertension: Secondary | ICD-10-CM

## 2013-09-05 DIAGNOSIS — R062 Wheezing: Secondary | ICD-10-CM

## 2013-09-05 DIAGNOSIS — K219 Gastro-esophageal reflux disease without esophagitis: Secondary | ICD-10-CM

## 2013-09-05 DIAGNOSIS — F341 Dysthymic disorder: Secondary | ICD-10-CM

## 2013-09-05 DIAGNOSIS — E1165 Type 2 diabetes mellitus with hyperglycemia: Secondary | ICD-10-CM

## 2013-09-05 DIAGNOSIS — K5900 Constipation, unspecified: Secondary | ICD-10-CM

## 2013-09-05 DIAGNOSIS — F0390 Unspecified dementia without behavioral disturbance: Secondary | ICD-10-CM

## 2013-09-05 DIAGNOSIS — G8929 Other chronic pain: Secondary | ICD-10-CM

## 2013-09-05 DIAGNOSIS — IMO0001 Reserved for inherently not codable concepts without codable children: Secondary | ICD-10-CM

## 2013-09-05 NOTE — Progress Notes (Signed)
Patient ID: Debbie Rodriguez, female   DOB: Apr 08, 1925, 80 y.o.   MRN: [id number]     [facility]   Allergies  Allergen Reactions  . Penicillins     REACTION: Reaction not known  . Sulfonamide Derivatives      Chief Complaint  Patient presents with  . Medical Managment of Chronic Issues    HPI:  She is being seen for the management of her chronic illnesses. Overall there has been no significant change in her status in the recent past. She is followed by hospice care. There are no concerns being voiced by the nursing staff at this time.   Past Medical History  Diagnosis Date  . DM2 (diabetes mellitus, type 2)   . CAD (coronary artery disease)     cath- 2007  . Alzheimer's dementia   . HLD (hyperlipidemia)   . HTN (hypertension)   . A-fib   . Breast cancer     Past Surgical History  Procedure Laterality Date  . Replacement total knee    . Abdominal hysterectomy      VITAL SIGNS BP 108/70  Pulse 67  Ht 5' (1.524 m)  Wt 196 lb 4.8 oz (89.041 kg)  BMI 38.34 kg/m2   Patient's Medications  New Prescriptions   No medications on file  Previous Medications   ACETAMINOPHEN (TYLENOL) 325 MG TABLET    Take 650 mg by mouth 2 (two) times daily.   ATENOLOL (TENORMIN) 25 MG TABLET    Take 25 mg by mouth daily.     BUSPIRONE (BUSPAR) 5 MG TABLET    Take 5 mg by mouth 2 (two) times daily.   FUROSEMIDE (LASIX) 20 MG TABLET    Take 20 mg by mouth daily.   IPRATROPIUM (ATROVENT) 0.03 % NASAL SPRAY    Place 2 sprays into both nostrils 2 (two) times daily.   IPRATROPIUM-ALBUTEROL (DUONEB) 0.5-2.5 (3) MG/3ML SOLN    Take 3 mLs by nebulization every 4 (four) hours as needed. And twice daily as needed   LORAZEPAM (ATIVAN) 0.5 MG TABLET    Take one tablet by mouth/under tongue every night at bedtime; May have one additional tablet during daytime and one additional tablet overnight as needed for restlessness/agitation   MORPHINE (ROXANOL) 20 MG/ML CONCENTRATED SOLUTION    Give 0.100ml  every two hours as needed for pain or distress; Give 0.75ml by mouth every eight hours   Zantac 150 mg  Take nightly    POTASSIUM CHLORIDE (K-DUR) 10 MEQ TABLET    Take 10 mEq by mouth daily.   SENNOSIDES-DOCUSATE SODIUM (SENOKOT-S) 8.6-50 MG TABLET    Take 2 tablets by mouth daily.  Modified Medications   No medications on file  Discontinued Medications   No medications on file    SIGNIFICANT DIAGNOSTIC EXAMS  12-29-12: chest x-ray: poor inspiration; minimal cardiomegaly no pulmonary vascular congestion or pleural effusion; interval resolution of patchy altelctasis or interstitial pneumonitis; has mild bronchitis   LABS REVIEWED:   12-07-12: u/a: neg 12-22-12: wbc 52; hgb 13.8; hct 44.4; mcv 94.1; plt 242; glucose 155; bun 18;creat 0.8; k+4.3;na++137 Liver normal albumin 3.1; chol 207; ldl 135; trig 213; hgb a1c 8.6   03-24-13: hgb a1c 7.3 06-24-13: wbc 4.7; hgb 14.3; hct 45.7; mcv 96.8 plt 217; glucose 138; bun 17; creat 0.7; k+4.6; na++135; liver normal albumin 3.2; chol 248; ldl 179 trig 176; hgb a1c 6.6      Review of Systems  Unable to perform ROS   Physical  Exam  Constitutional: She appears well-developed and well-nourished.  Neck: Neck supple. No JVD present.  Cardiovascular: Normal rate, regular rhythm and intact distal pulses.   Respiratory: Effort normal and breath sounds normal. No respiratory distress.  GI: Soft. Bowel sounds are normal. She exhibits no distension. There is no tenderness.  MS; Is able to move all extremities  Neurological: She is alert.  Skin: Skin is dry.     ASSESSMENT/ PLAN:  1. Hypertension: she is stable will continue tenormin 25 mg daily and will monitor her status  2. Edema: is stable will continue lasix 20 mg daily with k+ 10 meq daily and will monitor   3. Constipation; will continue senna s 2 tabs daily   4. Diabetes: her hgb a1c is 6.6; will not make changes and will monitor her status.   5. Chronic pain: there are no indications of  pain present will continue roxanol 5 mg every 8 hours and every 2 hours as needed and tylenol 65 mg twice daily and will monitor   6. Anxiety and depression; she is stable at this time; will continue her buspar 5 mg twice daily and will continue her ativan 0.5 mg nightly and daily as needed and will monitor   7. Wheezes; will continue duoneb every 4 hours as needed and atrovent nasal spray twice daily   8. Dementia: she is end stage: on no medications at this time; will not make changes will monitor her status    9. GERD:  Will continue zantac 150 mg daily and will monitor her status.          Ok Edwards NP [facility]  Contact [phone] Monday through Friday 8am- 5pm  After hours call [phone]

## 2013-09-08 ENCOUNTER — Encounter: Payer: Self-pay | Admitting: Adult Health

## 2013-09-22 LAB — HEMOGLOBIN A1C: HEMOGLOBIN A1C: 7 % — AB (ref 4.0–6.0)

## 2013-10-06 ENCOUNTER — Non-Acute Institutional Stay (SKILLED_NURSING_FACILITY): Payer: Medicare Other | Admitting: Adult Health

## 2013-10-06 DIAGNOSIS — F341 Dysthymic disorder: Secondary | ICD-10-CM

## 2013-10-06 DIAGNOSIS — F329 Major depressive disorder, single episode, unspecified: Secondary | ICD-10-CM

## 2013-10-06 DIAGNOSIS — E1165 Type 2 diabetes mellitus with hyperglycemia: Secondary | ICD-10-CM

## 2013-10-06 DIAGNOSIS — F419 Anxiety disorder, unspecified: Secondary | ICD-10-CM

## 2013-10-06 DIAGNOSIS — R062 Wheezing: Secondary | ICD-10-CM

## 2013-10-06 DIAGNOSIS — K5900 Constipation, unspecified: Secondary | ICD-10-CM

## 2013-10-06 DIAGNOSIS — F0390 Unspecified dementia without behavioral disturbance: Secondary | ICD-10-CM

## 2013-10-06 DIAGNOSIS — I10 Essential (primary) hypertension: Secondary | ICD-10-CM

## 2013-10-06 DIAGNOSIS — F32A Depression, unspecified: Secondary | ICD-10-CM

## 2013-10-06 DIAGNOSIS — K219 Gastro-esophageal reflux disease without esophagitis: Secondary | ICD-10-CM

## 2013-10-06 DIAGNOSIS — IMO0001 Reserved for inherently not codable concepts without codable children: Secondary | ICD-10-CM

## 2013-10-06 DIAGNOSIS — R609 Edema, unspecified: Secondary | ICD-10-CM

## 2013-10-06 DIAGNOSIS — G8929 Other chronic pain: Secondary | ICD-10-CM

## 2013-10-16 ENCOUNTER — Encounter: Payer: Self-pay | Admitting: Adult Health

## 2013-10-16 NOTE — Progress Notes (Signed)
Patient ID: Debbie Rodriguez, female   DOB: 1924/08/05, 80 y.o.   MRN: [id number]     [facility]  Allergies  Allergen Reactions  . Penicillins     REACTION: Reaction not known  . Sulfonamide Derivatives      Chief Complaint  Patient presents with  . Medical Management of Chronic Issues    HPI:  She is being seen for the management of her chronic illnesses. She is followed by hospice care. There is no change in her overall status. She is unable to fully participate in the hpi or ros. There are no concerns being voiced by the nursing staff at this time.    Past Medical History  Diagnosis Date  . DM2 (diabetes mellitus, type 2)   . CAD (coronary artery disease)     cath- 2007  . Alzheimer's dementia   . HLD (hyperlipidemia)   . HTN (hypertension)   . A-fib   . Breast cancer   . RENAL CALCULUS 01/22/2009    Qualifier: Diagnosis of  By: Burnett Kanaris      Past Surgical History  Procedure Laterality Date  . Replacement total knee    . Abdominal hysterectomy      VITAL SIGNS BP 110/68  Pulse 70  Ht 5' (1.524 m)  Wt 196 lb 4.8 oz (89.041 kg)  BMI 38.34 kg/m2   Patient's Medications  New Prescriptions   No medications on file  Previous Medications   ACETAMINOPHEN (TYLENOL) 325 MG TABLET    Take 650 mg by mouth 2 (two) times daily.   ATENOLOL (TENORMIN) 25 MG TABLET    Take 25 mg by mouth daily.     BUSPIRONE (BUSPAR) 5 MG TABLET    Take 5 mg by mouth 2 (two) times daily.   FUROSEMIDE (LASIX) 20 MG TABLET    Take 20 mg by mouth daily.   IPRATROPIUM (ATROVENT) 0.03 % NASAL SPRAY    Place 2 sprays into both nostrils 2 (two) times daily.   IPRATROPIUM-ALBUTEROL (DUONEB) 0.5-2.5 (3) MG/3ML SOLN    Take 3 mLs by nebulization every 4 (four) hours as needed. And twice daily as needed   LORAZEPAM (ATIVAN) 0.5 MG TABLET    Take one tablet by mouth/under tongue every night at bedtime; May have one additional tablet during daytime and one additional tablet overnight as  needed for restlessness/agitation   MORPHINE (ROXANOL) 20 MG/ML CONCENTRATED SOLUTION    Give 0.57ml every two hours as needed for pain or distress; Give 0.79ml by mouth every eight hours   POTASSIUM CHLORIDE (K-DUR) 10 MEQ TABLET    Take 10 mEq by mouth daily.   RANITIDINE (ZANTAC) 150 MG TABLET    Take 150 mg by mouth at bedtime.   SENNOSIDES-DOCUSATE SODIUM (SENOKOT-S) 8.6-50 MG TABLET    Take 2 tablets by mouth daily.  Modified Medications   No medications on file  Discontinued Medications   No medications on file    SIGNIFICANT DIAGNOSTIC EXAMS  12-29-12: chest x-ray: poor inspiration; minimal cardiomegaly no pulmonary vascular congestion or pleural effusion; interval resolution of patchy altelctasis or interstitial pneumonitis; has mild bronchitis   LABS REVIEWED:   12-07-12: u/a: neg 12-22-12: wbc 52; hgb 13.8; hct 44.4; mcv 94.1; plt 242; glucose 155; bun 18;creat 0.8; k+4.3;na++137 Liver normal albumin 3.1; chol 207; ldl 135; trig 213; hgb a1c 8.6   03-24-13: hgb a1c 7.3 06-24-13: wbc 4.7; hgb 14.3; hct 45.7; mcv 96.8 plt 217; glucose 138; bun 17; creat 0.7; k+4.6;  na++135; liver normal albumin 3.2; chol 248; ldl 179 trig 176; hgb a1c 6.6  09-22-13: hgb a1c 7.0      Review of Systems  Unable to perform ROS   Physical Exam  Constitutional: She appears well-developed and well-nourished.  Neck: Neck supple. No JVD present.  Cardiovascular: Normal rate, regular rhythm and intact distal pulses.   Respiratory: Effort normal and breath sounds normal. No respiratory distress.  GI: Soft. Bowel sounds are normal. She exhibits no distension. There is no tenderness.  MS; Is able to move all extremities  Neurological: She is alert.  Skin: Skin is dry.     ASSESSMENT/ PLAN:  1. Hypertension: she is stable will continue tenormin 25 mg daily and will monitor her status  2. Edema: is stable will continue lasix 20 mg daily with k+ 10 meq daily and will monitor   3. Constipation; will  continue senna s 2 tabs daily   4. Diabetes: her hgb a1c is 7.0; will not make changes and will monitor her status.   5. Chronic pain: there are no indications of pain present will continue roxanol 5 mg every 8 hours and every 2 hours as needed and tylenol 65 mg twice daily and will monitor   6. Anxiety and depression; she is stable at this time; will continue her buspar 5 mg twice daily and will continue her ativan 0.5 mg nightly and daily as needed and will monitor   7. Wheezes; will continue duoneb every 4 hours as needed and atrovent nasal spray twice daily   8. Dementia: she is end stage: on no medications at this time; will not make changes will monitor her status    9. GERD:  Will continue zantac 150 mg daily and will monitor her status.      Ok Edwards NP [facility]  Contact [phone] Monday through Friday 8am- 5pm  After hours call [phone]

## 2013-10-25 ENCOUNTER — Other Ambulatory Visit: Payer: Self-pay | Admitting: *Deleted

## 2013-10-25 MED ORDER — MORPHINE SULFATE (CONCENTRATE) 20 MG/ML PO SOLN: ORAL | Status: DC

## 2013-10-25 NOTE — Telephone Encounter (Signed)
[facility] 

## 2013-11-04 ENCOUNTER — Non-Acute Institutional Stay (SKILLED_NURSING_FACILITY): Payer: Medicare Other | Admitting: Adult Health

## 2013-11-04 DIAGNOSIS — E1165 Type 2 diabetes mellitus with hyperglycemia: Secondary | ICD-10-CM

## 2013-11-04 DIAGNOSIS — K219 Gastro-esophageal reflux disease without esophagitis: Secondary | ICD-10-CM

## 2013-11-04 DIAGNOSIS — I10 Essential (primary) hypertension: Secondary | ICD-10-CM

## 2013-11-04 DIAGNOSIS — F341 Dysthymic disorder: Secondary | ICD-10-CM

## 2013-11-04 DIAGNOSIS — F0390 Unspecified dementia without behavioral disturbance: Secondary | ICD-10-CM

## 2013-11-04 DIAGNOSIS — F419 Anxiety disorder, unspecified: Secondary | ICD-10-CM

## 2013-11-04 DIAGNOSIS — F329 Major depressive disorder, single episode, unspecified: Secondary | ICD-10-CM

## 2013-11-04 DIAGNOSIS — R062 Wheezing: Secondary | ICD-10-CM

## 2013-11-04 DIAGNOSIS — R609 Edema, unspecified: Secondary | ICD-10-CM

## 2013-11-04 DIAGNOSIS — G8929 Other chronic pain: Secondary | ICD-10-CM

## 2013-11-04 DIAGNOSIS — IMO0001 Reserved for inherently not codable concepts without codable children: Secondary | ICD-10-CM

## 2013-11-13 ENCOUNTER — Encounter: Payer: Self-pay | Admitting: Adult Health

## 2013-11-13 NOTE — Progress Notes (Signed)
Patient ID: Debbie Rodriguez, female   DOB: [birth year], 80 y.o.   MRN: [id number]     [facility]  Allergies  Allergen Reactions  . Penicillins     REACTION: Reaction not known  . Sulfonamide Derivatives      Chief Complaint  Patient presents with  . Annual Exam    HPI:  She is being seen for her annual exam. She had been followed by hospice care; however this was discontinued within the past several months. She has remained stable over the past several months. There are no concerns being voiced by the nursing staff. She is unable to participate in the hpi or ros.    Past Medical History  Diagnosis Date  . DM2 (diabetes mellitus, type 2)   . CAD (coronary artery disease)     cath- 2007  . Alzheimer's dementia   . HLD (hyperlipidemia)   . HTN (hypertension)   . A-fib   . Breast cancer   . RENAL CALCULUS 01/22/2009    Qualifier: Diagnosis of  By: Burnett Kanaris    . BREAST CANCER 01/22/2009    Qualifier: Diagnosis of  By: Burnett Kanaris    . URINARY INCONTINENCE 04/18/2010    Qualifier: Diagnosis of  By: Earley Favor MD, Cat      Past Surgical History  Procedure Laterality Date  . Replacement total knee    . Abdominal hysterectomy     Family History  Problem Relation Age of Onset  . Breast cancer Sister   . Throat cancer Sister    History   Social History  . Marital Status: Widowed    Spouse Name: N/A    Number of Children: N/A  . Years of Education: N/A   Occupational History  . Not on file.   Social History Main Topics  . Smoking status: Never Smoker   . Smokeless tobacco: Current User    Types: Snuff  . Alcohol Use: No  . Drug Use: Not on file  . Sexual Activity: Not on file   Other Topics Concern  . Not on file   Social History Narrative  . No narrative on file    CONSULTS:  [facility] care discontinued     VITAL SIGNS BP 128/86  Pulse 70  Ht 5' (1.524 m)  Wt 195 lb 9.6 oz (88.724 kg)  BMI 38.20 kg/m2   Patient's  Medications  New Prescriptions   No medications on file  Previous Medications   ACETAMINOPHEN (TYLENOL) 325 MG TABLET    Take 650 mg by mouth 2 (two) times daily.   ATENOLOL (TENORMIN) 25 MG TABLET    Take 25 mg by mouth daily.     BUSPIRONE (BUSPAR) 5 MG TABLET    Take 5 mg by mouth 2 (two) times daily.   FUROSEMIDE (LASIX) 20 MG TABLET    Take 20 mg by mouth daily.   IPRATROPIUM (ATROVENT) 0.03 % NASAL SPRAY    Place 2 sprays into both nostrils 2 (two) times daily.   IPRATROPIUM-ALBUTEROL (DUONEB) 0.5-2.5 (3) MG/3ML SOLN    Take 3 mLs by nebulization every 4 (four) hours as needed. And twice daily as needed   LORAZEPAM (ATIVAN) 0.5 MG TABLET    Take one tablet by mouth/under tongue every night at bedtime; May have one additional tablet during daytime and one additional tablet overnight as needed for restlessness/agitation   MORPHINE (ROXANOL) 20 MG/ML CONCENTRATED SOLUTION    Give 0.40ml by mouth every 8 hours as  needed for apin   POTASSIUM CHLORIDE (K-DUR) 10 MEQ TABLET    Take 10 mEq by mouth daily.   RANITIDINE (ZANTAC) 150 MG TABLET    Take 150 mg by mouth at bedtime.   SENNOSIDES-DOCUSATE SODIUM (SENOKOT-S) 8.6-50 MG TABLET    Take 2 tablets by mouth daily.  Modified Medications   No medications on file  Discontinued Medications   No medications on file    SIGNIFICANT DIAGNOSTIC EXAMS  12-29-12: chest x-ray: poor inspiration; minimal cardiomegaly no pulmonary vascular congestion or pleural effusion; interval resolution of patchy altelctasis or interstitial pneumonitis; has mild bronchitis   LABS REVIEWED:   12-07-12: u/a: neg 12-22-12: wbc 52; hgb 13.8; hct 44.4; mcv 94.1; plt 242; glucose 155; bun 18;creat 0.8; k+4.3;na++137 Liver normal albumin 3.1; chol 207; ldl 135; trig 213; hgb a1c 8.6   03-24-13: hgb a1c 7.3 06-24-13: wbc 4.7; hgb 14.3; hct 45.7; mcv 96.8 plt 217; glucose 138; bun 17; creat 0.7; k+4.6; na++135; liver normal albumin 3.2; chol 248; ldl 179 trig 176; hgb a1c 6.6    09-22-13: hgb a1c 7.0      Review of Systems  Unable to perform ROS   Physical Exam  Constitutional: She appears well-developed and well-nourished.  Neck: Neck supple. No JVD present.  Cardiovascular: Normal rate, regular rhythm and intact distal pulses.   Respiratory: Effort normal and breath sounds normal. No respiratory distress.  GI: Soft. Bowel sounds are normal. She exhibits no distension. There is no tenderness.  MS; Is able to move all extremities  Neurological: She is alert.  Skin: Skin is dry.     ASSESSMENT/ PLAN:  1. Hypertension: she is stable will continue tenormin 25 mg daily and will monitor her status  2. Edema: is stable will continue lasix 20 mg daily with k+ 10 meq daily and will monitor   3. Constipation; will continue senna s 2 tabs daily   4. Diabetes: her hgb a1c is 7.0; will not make changes and will monitor her status.   5. Chronic pain: there are no indications of pain present will continue roxanol 5 mg every 2 hours as needed and tylenol 65 mg twice daily and will monitor   6. Anxiety and depression; she is stable at this time; will continue her buspar 5 mg twice daily and will continue her ativan 0.5 mg nightly and daily as needed and will monitor   7. Wheezes; will continue duoneb every 4 hours as needed and atrovent nasal spray twice daily   8. Dementia: she is end stage: on no medications at this time; will not make changes will monitor her status    9. GERD:  Will continue zantac 150 mg daily and will monitor her status.    Her health maintenance is up to date.       Ok Edwards NP [facility]  Contact [phone] Monday through Friday 8am- 5pm  After hours call [phone]

## 2013-11-28 ENCOUNTER — Non-Acute Institutional Stay (SKILLED_NURSING_FACILITY): Payer: Medicare Other | Admitting: Internal Medicine

## 2013-11-28 ENCOUNTER — Encounter: Payer: Self-pay | Admitting: Internal Medicine

## 2013-11-28 DIAGNOSIS — F0393 Unspecified dementia, unspecified severity, with mood disturbance: Secondary | ICD-10-CM

## 2013-11-28 DIAGNOSIS — F0280 Dementia in other diseases classified elsewhere without behavioral disturbance: Secondary | ICD-10-CM

## 2013-11-28 DIAGNOSIS — R1310 Dysphagia, unspecified: Secondary | ICD-10-CM | POA: Insufficient documentation

## 2013-11-28 DIAGNOSIS — F329 Major depressive disorder, single episode, unspecified: Secondary | ICD-10-CM

## 2013-11-28 DIAGNOSIS — I10 Essential (primary) hypertension: Secondary | ICD-10-CM

## 2013-11-28 DIAGNOSIS — E119 Type 2 diabetes mellitus without complications: Secondary | ICD-10-CM | POA: Insufficient documentation

## 2013-11-28 DIAGNOSIS — K219 Gastro-esophageal reflux disease without esophagitis: Secondary | ICD-10-CM

## 2013-11-28 DIAGNOSIS — R609 Edema, unspecified: Secondary | ICD-10-CM

## 2013-11-28 DIAGNOSIS — F3289 Other specified depressive episodes: Secondary | ICD-10-CM

## 2013-11-28 DIAGNOSIS — F0390 Unspecified dementia without behavioral disturbance: Secondary | ICD-10-CM

## 2013-11-28 NOTE — Progress Notes (Signed)
Patient ID: Debbie Rodriguez, female   DOB: [birth year], 80 y.o.   MRN: [id number]    Chief Complaint  Patient presents with  . Medical Management of Chronic Issues    routine visit   Allergies  Allergen Reactions  . Penicillins     REACTION: Reaction not known  . Sulfonamide Derivatives    Facility: [facility]   Code status- DNR  HPI 80 y/o female patient is seen today for routine visit. She has history of DM, HTN, GERD and dementia. She has been at her baseline with no new concerns from the staff. She denies any complaints this visit. Her history taking and ROS is limited with her dementia. No new falls reported. No new skin concerns.  ROS Unable to obtain  Past Medical History  Diagnosis Date  . DM2 (diabetes mellitus, type 2)   . CAD (coronary artery disease)     cath- 2007  . Alzheimer's dementia   . HLD (hyperlipidemia)   . HTN (hypertension)   . A-fib   . Breast cancer   . RENAL CALCULUS 01/22/2009    Qualifier: Diagnosis of  By: Burnett Kanaris    . BREAST CANCER 01/22/2009    Qualifier: Diagnosis of  By: Burnett Kanaris    . URINARY INCONTINENCE 04/18/2010    Qualifier: Diagnosis of  By: Earley Favor MD, Cat     Past Surgical History  Procedure Laterality Date  . Replacement total knee    . Abdominal hysterectomy     Medication reviewed. See [facility]  Physical exam BP 126/78  Pulse 65  Temp(Src) 97.8 F (36.6 C)  Resp 18  SpO2 95%  General- elderly female in no acute distress Head- atraumatic, normocephalic Eyes- PERRLA, EOMI, no pallor, no icterus, no discharge Neck- no lymphadenopathy, no thyromegaly Oral mucosa- moist, poor dental hygiene Cardiovascular- normal s1,s2, no murmurs/ rubs/ gallops, normal distal pulses Respiratory- bilateral clear to auscultation, no wheeze, no rhonchi, no crackles Abdomen- bowel sounds present, soft, non tender Musculoskeletal- able to move all 4 extremities, no leg edema Neurological- no focal  deficit, alert but not oriented Skin- warm and dry Psychiatry- normal mood and affect  Labs 12-22-12: wbc 52; hgb 13.8; hct 44.4; mcv 94.1; plt 242; glucose 155; bun 18;creat 0.8; k+4.3;na++137 Liver normal albumin 3.1; chol 207; ldl 135; trig 213; hgb a1c 8.6    03-24-13: hgb a1c 7.3 06-24-13: wbc 4.7; hgb 14.3; hct 45.7; mcv 96.8 plt 217; glucose 138; bun 17; creat 0.7; k+4.6; na++135; liver normal albumin 3.2; chol 248; ldl 179 trig 176; hgb a1c 6.6   09-22-13: hgb a1c 7.0   Assessment/plan  Dysphagia- continue mechanical soft diet- changed to chopped meat from today. Aspiration precautions  Dementia- unable to provide ROS. Decline anticipated. Off all medications. Monitor mood and continue anxiety medication. Fall precautions. Monitor po intake.   Hypertension- stable bp readings. continue tenormin 25 mg daily, laisx 20 mg daily and kcl supplement  Depression with dementia- continue buspar 5 mg twice daily and ativan 0.5 mg nightly and daily as needed and monitor   GERD- stable on zantac 150 mg daily  Edema- continue lasix 20 mg daily, kcl supplement, monitor renal function  Dm type 2- diet controlled for now, off all medication, monitor

## 2013-12-21 ENCOUNTER — Non-Acute Institutional Stay (SKILLED_NURSING_FACILITY): Payer: Medicare Other | Admitting: Adult Health

## 2013-12-21 DIAGNOSIS — F0393 Unspecified dementia, unspecified severity, with mood disturbance: Secondary | ICD-10-CM

## 2013-12-21 DIAGNOSIS — R062 Wheezing: Secondary | ICD-10-CM

## 2013-12-21 DIAGNOSIS — R609 Edema, unspecified: Secondary | ICD-10-CM

## 2013-12-21 DIAGNOSIS — K219 Gastro-esophageal reflux disease without esophagitis: Secondary | ICD-10-CM

## 2013-12-21 DIAGNOSIS — K5900 Constipation, unspecified: Secondary | ICD-10-CM

## 2013-12-21 DIAGNOSIS — F329 Major depressive disorder, single episode, unspecified: Secondary | ICD-10-CM

## 2013-12-21 DIAGNOSIS — IMO0001 Reserved for inherently not codable concepts without codable children: Secondary | ICD-10-CM

## 2013-12-21 DIAGNOSIS — I10 Essential (primary) hypertension: Secondary | ICD-10-CM

## 2013-12-21 DIAGNOSIS — F0280 Dementia in other diseases classified elsewhere without behavioral disturbance: Secondary | ICD-10-CM

## 2013-12-21 DIAGNOSIS — F3289 Other specified depressive episodes: Secondary | ICD-10-CM

## 2013-12-21 DIAGNOSIS — F0390 Unspecified dementia without behavioral disturbance: Secondary | ICD-10-CM

## 2013-12-21 DIAGNOSIS — E1165 Type 2 diabetes mellitus with hyperglycemia: Secondary | ICD-10-CM

## 2013-12-21 DIAGNOSIS — G8929 Other chronic pain: Secondary | ICD-10-CM

## 2013-12-22 ENCOUNTER — Encounter: Payer: Self-pay | Admitting: Adult Health

## 2013-12-22 LAB — ALBUMIN, URINE, RANDOM: Microalb, Ur: 44.8

## 2013-12-22 LAB — BASIC METABOLIC PANEL
BUN: 16 mg/dL (ref 4–21)
CREATININE: 0.7 mg/dL (ref 0.5–1.1)
GLUCOSE: 142 mg/dL
Potassium: 4.4 mmol/L (ref 3.4–5.3)
Sodium: 137 mmol/L (ref 137–147)

## 2013-12-22 LAB — CBC AND DIFFERENTIAL
HCT: 46 % (ref 36–46)
HEMOGLOBIN: 14.1 g/dL (ref 12.0–16.0)
PLATELETS: 185 10*3/uL (ref 150–399)
WBC: 5.3 10*3/mL

## 2013-12-22 LAB — LIPID PANEL
CHOLESTEROL: 249 mg/dL — AB (ref 0–200)
LDL CALC: 173 mg/dL
Triglycerides: 167 mg/dL — AB (ref 40–160)

## 2013-12-22 LAB — HEPATIC FUNCTION PANEL
ALT: 6 U/L — AB (ref 7–35)
AST: 10 U/L — AB (ref 13–35)
Alkaline Phosphatase: 137 U/L — AB (ref 25–125)

## 2013-12-22 LAB — HEMOGLOBIN A1C: HEMOGLOBIN A1C: 7.6 % — AB (ref 4.0–6.0)

## 2013-12-22 NOTE — Progress Notes (Signed)
Patient ID: Debbie Rodriguez, female   DOB: Sep 28, 1924, 80 y.o.   MRN: [id number]     [facility]  Allergies  Allergen Reactions  . Penicillins     REACTION: Reaction not known  . Sulfonamide Derivatives      Chief Complaint  Patient presents with  . Medical Management of Chronic Issues    HPI:  She is being seen for the management of her chronic illnesses. Overall she remains stable. There are no concerns being voiced by the nursing staff at this time. She tells me today that she is feeling good.    Past Medical History  Diagnosis Date  . DM2 (diabetes mellitus, type 2)   . CAD (coronary artery disease)     cath- 2007  . Alzheimer's dementia   . HLD (hyperlipidemia)   . HTN (hypertension)   . A-fib   . Breast cancer   . RENAL CALCULUS 01/22/2009    Qualifier: Diagnosis of  By: Burnett Kanaris    . BREAST CANCER 01/22/2009    Qualifier: Diagnosis of  By: Burnett Kanaris    . URINARY INCONTINENCE 04/18/2010    Qualifier: Diagnosis of  By: Earley Favor MD, Cat      Past Surgical History  Procedure Laterality Date  . Replacement total knee    . Abdominal hysterectomy      VITAL SIGNS BP 118/56  Pulse 74  Ht 5' (1.524 m)  Wt 193 lb 14.4 oz (87.952 kg)  BMI 37.87 kg/m2   Patient's Medications  New Prescriptions   No medications on file  Previous Medications   ACETAMINOPHEN (TYLENOL) 325 MG TABLET    Take 650 mg by mouth 2 (two) times daily.   ATENOLOL (TENORMIN) 25 MG TABLET    Take 25 mg by mouth daily.     BUSPIRONE (BUSPAR) 5 MG TABLET    Take 5 mg by mouth 2 (two) times daily.   FUROSEMIDE (LASIX) 20 MG TABLET    Take 20 mg by mouth daily.   IPRATROPIUM (ATROVENT) 0.03 % NASAL SPRAY    Place 2 sprays into both nostrils 2 (two) times daily.   IPRATROPIUM-ALBUTEROL (DUONEB) 0.5-2.5 (3) MG/3ML SOLN    Take 3 mLs by nebulization every 4 (four) hours as needed. And twice daily as needed   LORAZEPAM (ATIVAN) 0.5 MG TABLET    Take one tablet by mouth/under tongue every  night at bedtime; May have one additional tablet during daytime and one additional tablet overnight as needed for restlessness/agitation   MORPHINE (ROXANOL) 20 MG/ML CONCENTRATED SOLUTION    Give 0.60ml by mouth every 8 hours as needed for apin   POTASSIUM CHLORIDE (K-DUR) 10 MEQ TABLET    Take 10 mEq by mouth daily.   RANITIDINE (ZANTAC) 150 MG TABLET    Take 150 mg by mouth at bedtime.   SENNOSIDES-DOCUSATE SODIUM (SENOKOT-S) 8.6-50 MG TABLET    Take 2 tablets by mouth daily.  Modified Medications   No medications on file  Discontinued Medications   No medications on file    SIGNIFICANT DIAGNOSTIC EXAMS  12-29-12: chest x-ray: poor inspiration; minimal cardiomegaly no pulmonary vascular congestion or pleural effusion; interval resolution of patchy altelctasis or interstitial pneumonitis; has mild bronchitis   LABS REVIEWED:   12-07-12: u/a: neg 12-22-12: wbc 52; hgb 13.8; hct 44.4; mcv 94.1; plt 242; glucose 155; bun 18;creat 0.8; k+4.3;na++137 Liver normal albumin 3.1; chol 207; ldl 135; trig 213; hgb a1c 8.6   03-24-13: hgb a1c 7.3 06-24-13: wbc  4.7; hgb 14.3; hct 45.7; mcv 96.8 plt 217; glucose 138; bun 17; creat 0.7; k+4.6; na++135; liver normal albumin 3.2; chol 248; ldl 179 trig 176; hgb a1c 6.6  09-22-13: hgb a1c 7.0     Review of Systems  Constitutional: Negative for malaise/fatigue.  Respiratory: Negative for cough and shortness of breath.   Cardiovascular: Negative for chest pain and palpitations.  Gastrointestinal: Negative for heartburn and constipation.  Musculoskeletal: Negative for myalgias.  Skin: Negative.   Psychiatric/Behavioral: The patient is not nervous/anxious.      Physical Exam  Constitutional: She appears well-developed and well-nourished.  Neck: Neck supple. No JVD present.  Cardiovascular: Normal rate, regular rhythm and intact distal pulses.   Respiratory: Effort normal and breath sounds normal. No respiratory distress.  GI: Soft. Bowel sounds are  normal. She exhibits no distension. There is no tenderness.  MS; Is able to move all extremities  Neurological: She is alert.  Skin: Skin is dry.     ASSESSMENT/ PLAN:  1. Hypertension: she is stable will continue tenormin 25 mg daily and will monitor her status  2. Edema: is stable will continue lasix 20 mg daily with k+ 10 meq daily and will monitor   3. Constipation; will continue senna s 2 tabs daily   4. Diabetes: her hgb a1c is 7.0; will not make changes and will monitor her status.   5. Chronic pain: there are no indications of pain present will continue roxanol 5 mg every 2 hours as needed and tylenol 65 mg twice daily and will monitor   6. Anxiety and depression; she is stable at this time; will continue her buspar 5 mg twice daily and will continue her ativan 0.5 mg nightly and daily as needed and will monitor   7. Wheezes; will continue duoneb every 4 hours as needed and atrovent nasal spray twice daily   8. Dementia: she is end stage: on no medications at this time; will not make changes will monitor her status    9. GERD:  Will continue zantac 150 mg daily and will monitor her status.    Will check cbc; cmp; lipids; hgb a1c; urine for micro-albumin next draw     Ok Edwards NP [facility]  Contact [phone] Monday through Friday 8am- 5pm  After hours call [phone]

## 2014-01-02 ENCOUNTER — Other Ambulatory Visit: Payer: Self-pay | Admitting: *Deleted

## 2014-01-02 ENCOUNTER — Other Ambulatory Visit: Payer: Self-pay

## 2014-01-02 MED ORDER — LORAZEPAM 0.5 MG PO TABS: ORAL_TABLET | ORAL | Status: DC

## 2014-01-02 NOTE — Telephone Encounter (Signed)
[facility] 

## 2014-01-02 NOTE — Telephone Encounter (Signed)
Rx faxed to [facility] @ [phone], phone number [phone]  

## 2014-01-25 ENCOUNTER — Non-Acute Institutional Stay (SKILLED_NURSING_FACILITY): Payer: Medicare Other | Admitting: Adult Health

## 2014-01-25 DIAGNOSIS — G8929 Other chronic pain: Secondary | ICD-10-CM

## 2014-01-25 DIAGNOSIS — F0280 Dementia in other diseases classified elsewhere without behavioral disturbance: Secondary | ICD-10-CM

## 2014-01-25 DIAGNOSIS — R062 Wheezing: Secondary | ICD-10-CM

## 2014-01-25 DIAGNOSIS — E1165 Type 2 diabetes mellitus with hyperglycemia: Secondary | ICD-10-CM

## 2014-01-25 DIAGNOSIS — F0393 Unspecified dementia, unspecified severity, with mood disturbance: Secondary | ICD-10-CM

## 2014-01-25 DIAGNOSIS — I10 Essential (primary) hypertension: Secondary | ICD-10-CM

## 2014-01-25 DIAGNOSIS — F329 Major depressive disorder, single episode, unspecified: Secondary | ICD-10-CM

## 2014-01-25 DIAGNOSIS — IMO0001 Reserved for inherently not codable concepts without codable children: Secondary | ICD-10-CM

## 2014-01-25 DIAGNOSIS — F3289 Other specified depressive episodes: Secondary | ICD-10-CM

## 2014-01-25 DIAGNOSIS — F0390 Unspecified dementia without behavioral disturbance: Secondary | ICD-10-CM

## 2014-01-25 DIAGNOSIS — R609 Edema, unspecified: Secondary | ICD-10-CM

## 2014-02-02 ENCOUNTER — Encounter: Payer: Self-pay | Admitting: Adult Health

## 2014-02-02 DIAGNOSIS — I10 Essential (primary) hypertension: Secondary | ICD-10-CM | POA: Insufficient documentation

## 2014-02-02 NOTE — Progress Notes (Signed)
Patient ID: Debbie Rodriguez, female   DOB: 04-07-1925, 80 y.o.   MRN: [id number]     [facility]  Allergies  Allergen Reactions  . Penicillins     REACTION: Reaction not known  . Sulfonamide Derivatives      Chief Complaint  Patient presents with  . Medical Management of Chronic Issues    HPI:  She is a long term resident of this facility being seen for the management of her chronic illnesses. Overall her status is stable. She is not making any complaints; she states that she is feeling good. There are no concerns being voiced by the nursing staff at this time    Past Medical History  Diagnosis Date  . DM2 (diabetes mellitus, type 2)   . CAD (coronary artery disease)     cath- 2007  . Alzheimer's dementia   . HLD (hyperlipidemia)   . HTN (hypertension)   . A-fib   . Breast cancer   . RENAL CALCULUS 01/22/2009    Qualifier: Diagnosis of  By: Burnett Kanaris    . BREAST CANCER 01/22/2009    Qualifier: Diagnosis of  By: Burnett Kanaris    . URINARY INCONTINENCE 04/18/2010    Qualifier: Diagnosis of  By: Earley Favor MD, Cat      Past Surgical History  Procedure Laterality Date  . Replacement total knee    . Abdominal hysterectomy      VITAL SIGNS BP 128/89  Pulse 63  Ht 5' (1.524 m)  Wt 197 lb 9.6 oz (89.631 kg)  BMI 38.59 kg/m2  SpO2 97%   Patient's Medications  New Prescriptions   No medications on file  Previous Medications   ACETAMINOPHEN (TYLENOL) 325 MG TABLET    Take 650 mg by mouth 2 (two) times daily.   ATENOLOL (TENORMIN) 25 MG TABLET    Take 25 mg by mouth daily.     BUSPIRONE (BUSPAR) 5 MG TABLET    Take 5 mg by mouth 2 (two) times daily.   FUROSEMIDE (LASIX) 20 MG TABLET    Take 20 mg by mouth daily.   IPRATROPIUM (ATROVENT) 0.03 % NASAL SPRAY    Place 2 sprays into both nostrils 2 (two) times daily.   IPRATROPIUM-ALBUTEROL (DUONEB) 0.5-2.5 (3) MG/3ML SOLN    Take 3 mLs by nebulization every 4 (four) hours as needed. And twice daily as needed   LORAZEPAM (ATIVAN) 0.5 MG TABLET    Take one tablet by mouth/under tongue every night at bedtime for restlessness/agitation   MORPHINE (ROXANOL) 20 MG/ML CONCENTRATED SOLUTION    Give 0.29ml by mouth every 8 hours as needed for apin   POTASSIUM CHLORIDE (K-DUR) 10 MEQ TABLET    Take 10 mEq by mouth daily.   RANITIDINE (ZANTAC) 150 MG TABLET    Take 150 mg by mouth at bedtime.   SENNOSIDES-DOCUSATE SODIUM (SENOKOT-S) 8.6-50 MG TABLET    Take 2 tablets by mouth daily.  Modified Medications   No medications on file  Discontinued Medications   No medications on file    SIGNIFICANT DIAGNOSTIC EXAMS  12-29-12: chest x-ray: poor inspiration; minimal cardiomegaly no pulmonary vascular congestion or pleural effusion; interval resolution of patchy altelctasis or interstitial pneumonitis; has mild bronchitis  01-08-14: right hand x-ray: 1. On definite radiographic evidence of acute fracture or dislocation. 2.mild osteoporosis; 3. Moderate osteoarthritis.    LABS REVIEWED:    03-24-13: hgb a1c 7.3 06-24-13: wbc 4.7; hgb 14.3; hct 45.7; mcv 96.8 plt 217; glucose 138; bun 17;  creat 0.7; k+4.6; na++135; liver normal albumin 3.2; chol 248; ldl 179 trig 176; hgb a1c 6.6  09-22-13: hgb a1c 7.0  12-22-13: wbc 5.3; hgb 1.4;1; hct 45.6; mcv 99.8; plt 185; glucose 142; bun 16; creat 0.7; k+4.4; na++137; liver normal albumin 3.2; chol 249; ldl 173; trig 167; hgb a1c 7.6; urine micro-albumin: 44.8     Review of Systems  Constitutional: Negative for malaise/fatigue.  Respiratory: Negative for cough and shortness of breath.   Cardiovascular: Negative for chest pain and palpitations.  Gastrointestinal: Negative for heartburn and constipation.  Musculoskeletal: Negative for myalgias.  Skin: Negative.   Psychiatric/Behavioral: The patient is not nervous/anxious.      Physical Exam  Constitutional: She appears well-developed and well-nourished.  Neck: Neck supple. No JVD present.  Cardiovascular: Normal rate,  regular rhythm and intact distal pulses.   Respiratory: Effort normal and breath sounds normal. No respiratory distress.  GI: Soft. Bowel sounds are normal. She exhibits no distension. There is no tenderness.  MS; Is able to move all extremities  Neurological: She is alert.  Skin: Skin is dry.     ASSESSMENT/ PLAN:  1. Hypertension: she is stable will continue tenormin 25 mg daily and will monitor her status  2. Edema: is stable will continue lasix 20 mg daily with k+ 10 meq daily and will monitor   3. Constipation; will continue senna s 2 tabs daily   4. Diabetes: her hgb a1c is 7.6; will not make changes and will monitor her status.   5. Chronic pain: there are no indications of pain present will continue roxanol 5 mg every 2 hours as needed and tylenol 65 mg twice daily and will monitor   6. Anxiety and depression; she is stable at this time; will continue her buspar 5 mg twice daily and will continue her ativan 0.5 mg nightly as needed and will monitor   7. Wheezes; will continue duoneb every 4 hours as needed and atrovent nasal spray twice daily   8. Dementia: she is end stage: on no medications at this time; will not make changes will monitor her status    9. GERD:  Will continue zantac 150 mg daily and will monitor her status.       Ok Edwards NP [facility]  Contact [phone] Monday through Friday 8am- 5pm  After hours call [phone]

## 2014-02-21 ENCOUNTER — Other Ambulatory Visit: Payer: Self-pay | Admitting: *Deleted

## 2014-02-21 MED ORDER — LORAZEPAM 0.5 MG PO TABS: ORAL_TABLET | ORAL | Status: DC

## 2014-02-21 NOTE — Telephone Encounter (Signed)
[facility] 

## 2014-02-28 ENCOUNTER — Non-Acute Institutional Stay (SKILLED_NURSING_FACILITY): Payer: Medicare Other | Admitting: Adult Health

## 2014-02-28 DIAGNOSIS — F0390 Unspecified dementia without behavioral disturbance: Secondary | ICD-10-CM

## 2014-02-28 DIAGNOSIS — IMO0001 Reserved for inherently not codable concepts without codable children: Secondary | ICD-10-CM

## 2014-02-28 DIAGNOSIS — F329 Major depressive disorder, single episode, unspecified: Secondary | ICD-10-CM

## 2014-02-28 DIAGNOSIS — E1165 Type 2 diabetes mellitus with hyperglycemia: Secondary | ICD-10-CM

## 2014-02-28 DIAGNOSIS — R609 Edema, unspecified: Secondary | ICD-10-CM

## 2014-02-28 DIAGNOSIS — F0393 Unspecified dementia, unspecified severity, with mood disturbance: Secondary | ICD-10-CM

## 2014-02-28 DIAGNOSIS — K219 Gastro-esophageal reflux disease without esophagitis: Secondary | ICD-10-CM

## 2014-02-28 DIAGNOSIS — F0280 Dementia in other diseases classified elsewhere without behavioral disturbance: Secondary | ICD-10-CM

## 2014-02-28 DIAGNOSIS — F3289 Other specified depressive episodes: Secondary | ICD-10-CM

## 2014-02-28 DIAGNOSIS — I10 Essential (primary) hypertension: Secondary | ICD-10-CM

## 2014-03-05 NOTE — Progress Notes (Signed)
Patient ID: Debbie Rodriguez, female   DOB: 20-May-1925, 80 y.o.   MRN: [id number]     [facility]  Allergies  Allergen Reactions  . Penicillins     REACTION: Reaction not known  . Sulfonamide Derivatives      Chief Complaint  Patient presents with  . Medical Management of Chronic Issues    HPI:  She is a long term resident of this facility being seen for the management of her chronic illnesses. Overall she is doing well. She is not voicing any concerns or complaints at this time. There are no concerns being voiced by the nursing staff at this time.    Past Medical History  Diagnosis Date  . DM2 (diabetes mellitus, type 2)   . CAD (coronary artery disease)     cath- 2007  . Alzheimer's dementia   . HLD (hyperlipidemia)   . HTN (hypertension)   . A-fib   . Breast cancer   . RENAL CALCULUS 01/22/2009    Qualifier: Diagnosis of  By: Burnett Kanaris    . BREAST CANCER 01/22/2009    Qualifier: Diagnosis of  By: Burnett Kanaris    . URINARY INCONTINENCE 04/18/2010    Qualifier: Diagnosis of  By: Earley Favor MD, Cat      Past Surgical History  Procedure Laterality Date  . Replacement total knee    . Abdominal hysterectomy      VITAL SIGNS BP 138/75  Pulse 70  Ht 5' (1.524 m)  Wt 197 lb 9.6 oz (89.631 kg)  BMI 38.59 kg/m2   Patient's Medications  New Prescriptions   No medications on file  Previous Medications   ACETAMINOPHEN (TYLENOL) 325 MG TABLET    Take 650 mg by mouth 2 (two) times daily.   ATENOLOL (TENORMIN) 25 MG TABLET    Take 25 mg by mouth daily.     BUSPIRONE (BUSPAR) 5 MG TABLET    Take 5 mg by mouth 2 (two) times daily.   FUROSEMIDE (LASIX) 20 MG TABLET    Take 20 mg by mouth daily.   LORAZEPAM (ATIVAN) 0.5 MG TABLET    Take one tablet by mouth every night at bedtime as needed for sleep/restlessness   MORPHINE (ROXANOL) 20 MG/ML CONCENTRATED SOLUTION    Give 0.28ml by mouth every 8 hours as needed for apin   POTASSIUM CHLORIDE (K-DUR) 10 MEQ TABLET    Take  10 mEq by mouth daily.   RANITIDINE (ZANTAC) 150 MG TABLET    Take 150 mg by mouth at bedtime.   SENNOSIDES-DOCUSATE SODIUM (SENOKOT-S) 8.6-50 MG TABLET    Take 2 tablets by mouth daily.  Modified Medications   No medications on file  Discontinued Medications   No medications on file    SIGNIFICANT DIAGNOSTIC EXAMS   12-29-12: chest x-ray: poor inspiration; minimal cardiomegaly no pulmonary vascular congestion or pleural effusion; interval resolution of patchy altelctasis or interstitial pneumonitis; has mild bronchitis  01-08-14: right hand x-ray: 1. On definite radiographic evidence of acute fracture or dislocation. 2.mild osteoporosis; 3. Moderate osteoarthritis.    LABS REVIEWED:    03-24-13: hgb a1c 7.3 06-24-13: wbc 4.7; hgb 14.3; hct 45.7; mcv 96.8 plt 217; glucose 138; bun 17; creat 0.7; k+4.6; na++135; liver normal albumin 3.2; chol 248; ldl 179 trig 176; hgb a1c 6.6  09-22-13: hgb a1c 7.0  12-22-13: wbc 5.3; hgb 1.4;1; hct 45.6; mcv 99.8; plt 185; glucose 142; bun 16; creat 0.7; k+4.4; na++137; liver normal albumin 3.2; chol 249; ldl 173;  trig 167; hgb a1c 7.6; urine micro-albumin: 44.8     Review of Systems  Constitutional: Negative for malaise/fatigue.  Respiratory: Negative for cough and shortness of breath.   Cardiovascular: Negative for chest pain and palpitations.  Gastrointestinal: Negative for heartburn and constipation.  Musculoskeletal: Negative for myalgias.  Skin: Negative.   Psychiatric/Behavioral: The patient is not nervous/anxious.      Physical Exam  Constitutional: She appears well-developed and well-nourished.  Neck: Neck supple. No JVD present.  Cardiovascular: Normal rate, regular rhythm and intact distal pulses.   Respiratory: Effort normal and breath sounds normal. No respiratory distress.  GI: Soft. Bowel sounds are normal. She exhibits no distension. There is no tenderness.  MS; Is able to move all extremities  Neurological: She is alert.  Skin:  Skin is dry.     ASSESSMENT/ PLAN:  1. Hypertension: she is stable will continue tenormin 25 mg daily and will monitor her status  2. Edema: is stable will continue lasix 20 mg daily with k+ 10 meq daily and will monitor   3. Constipation; will continue senna s 2 tabs daily   4. Diabetes: her hgb a1c is 7.6; currently not on medications  will not make changes and will monitor her status.   5. Chronic pain: there are no indications of pain present will continue roxanol 5 mg every 2 hours as needed and tylenol 65 mg twice daily and will monitor   6. Anxiety and depression; she is stable at this time; will continue her buspar 5 mg twice daily and will continue her ativan 0.5 mg nightly as needed and will monitor   7. Dementia: she is end stage: on no medications at this time; will not make changes will monitor her status    8. GERD:  Will continue zantac 150 mg daily and will monitor her status.       Ok Edwards NP [facility]  Contact [phone] Monday through Friday 8am- 5pm  After hours call [phone]

## 2014-03-24 LAB — HEMOGLOBIN A1C: HEMOGLOBIN A1C: 7.6 % — AB (ref 4.0–6.0)

## 2014-03-27 ENCOUNTER — Encounter: Payer: Self-pay | Admitting: Adult Health

## 2014-03-27 ENCOUNTER — Non-Acute Institutional Stay (SKILLED_NURSING_FACILITY): Payer: Medicare Other | Admitting: Adult Health

## 2014-03-27 DIAGNOSIS — K5900 Constipation, unspecified: Secondary | ICD-10-CM

## 2014-03-27 DIAGNOSIS — F329 Major depressive disorder, single episode, unspecified: Secondary | ICD-10-CM

## 2014-03-27 DIAGNOSIS — G8929 Other chronic pain: Secondary | ICD-10-CM

## 2014-03-27 DIAGNOSIS — R609 Edema, unspecified: Secondary | ICD-10-CM

## 2014-03-27 DIAGNOSIS — F0393 Unspecified dementia, unspecified severity, with mood disturbance: Secondary | ICD-10-CM

## 2014-03-27 DIAGNOSIS — E119 Type 2 diabetes mellitus without complications: Secondary | ICD-10-CM

## 2014-03-27 DIAGNOSIS — F0280 Dementia in other diseases classified elsewhere without behavioral disturbance: Secondary | ICD-10-CM

## 2014-03-27 DIAGNOSIS — I10 Essential (primary) hypertension: Secondary | ICD-10-CM

## 2014-03-27 DIAGNOSIS — F0390 Unspecified dementia without behavioral disturbance: Secondary | ICD-10-CM

## 2014-03-27 NOTE — Progress Notes (Signed)
Debbie Rodriguez   Code Status: DNR  Allergies  Allergen Reactions  . Penicillins     REACTION: Reaction not known  . Sulfonamide Derivatives     Chief Complaint: Medical management of chronic health issues  HPI:  Debbie Rodriguez is an 80 yr old female being seen today for a routine visit. Staff denies any concerns. Overall she is doing well. Pt denies any complaints at this time. She denies any shortness of breath, chest pain, or pain.   Review of Systems:  Constitutional: Negative for fever, chills, weight loss, malaise/fatigue and diaphoresis.   Respiratory: Negative for cough, sputum production, shortness of breath and wheezing.   Cardiovascular: Negative for chest pain, palpitations, orthopnea and leg swelling.  Gastrointestinal: Negative for heartburn, nausea, vomiting, abdominal pain, diarrhea and constipation.  Genitourinary: Negative for dysuria, urgency, frequency, hematuria and flank pain.  Musculoskeletal: Negative for back pain, falls, joint pain and myalgias.  Skin: Negative for itching and rash.  Neurological: Negative for weakness,dizziness, tingling, focal weakness and headaches.  Psychiatric/Behavioral:  The patient is not nervous/anxious.     Past Medical History  Diagnosis Date  . DM2 (diabetes mellitus, type 2)   . CAD (coronary artery disease)     cath- 2007  . Alzheimer's dementia   . HLD (hyperlipidemia)   . HTN (hypertension)   . A-fib   . Breast cancer   . RENAL CALCULUS 01/22/2009    Qualifier: Diagnosis of  By: Burnett Kanaris    . BREAST CANCER 01/22/2009    Qualifier: Diagnosis of  By: Burnett Kanaris    . URINARY INCONTINENCE 04/18/2010    Qualifier: Diagnosis of  By: Earley Favor MD, Cat     Past Surgical History  Procedure Laterality Date  . Replacement total knee    . Abdominal hysterectomy     Social History:   reports that she has never smoked. Her smokeless tobacco use includes Snuff. She reports that she does not drink alcohol. Her drug  history is not on file.  Family History  Problem Relation Age of Onset  . Breast cancer Sister   . Throat cancer Sister     Medications:   Medication List       This list is accurate as of: 03/27/14  3:09 PM.  Always use your most recent med list.               acetaminophen 325 MG tablet  Commonly known as:  TYLENOL  Take 650 mg by mouth 2 (two) times daily.     atenolol 25 MG tablet  Commonly known as:  TENORMIN  Take 25 mg by mouth daily.     busPIRone 5 MG tablet  Commonly known as:  BUSPAR  Take 5 mg by mouth 2 (two) times daily.     furosemide 20 MG tablet  Commonly known as:  LASIX  Take 20 mg by mouth daily.     ipratropium 0.03 % nasal spray  Commonly known as:  ATROVENT  Rodriguez 2 sprays into both nostrils 2 (two) times daily.     ipratropium-albuterol 0.5-2.5 (3) MG/3ML Soln  Commonly known as:  DUONEB  Take 3 mLs by nebulization every 4 (four) hours as needed. And twice daily as needed     LORazepam 0.5 MG tablet  Commonly known as:  ATIVAN  Take one tablet by mouth every night at bedtime as needed for sleep/restlessness     morphine 20 MG/ML concentrated solution  Commonly known as:  ROXANOL  Give 0.68ml by mouth every 8 hours as needed for apin     potassium chloride 10 MEQ tablet  Commonly known as:  K-DUR  Take 10 mEq by mouth daily.     ranitidine 150 MG tablet  Commonly known as:  ZANTAC  Take 150 mg by mouth at bedtime.     sennosides-docusate sodium 8.6-50 MG tablet  Commonly known as:  SENOKOT-S  Take 2 tablets by mouth daily.          Physical Exam:  Filed Vitals:   03/27/14 1503  BP: 117/71  Pulse: 62  Temp: 97.9 F (36.6 C)  Resp: 18  Weight: 196 lb 3.2 oz (88.996 kg)  SpO2: 95%    General- elderly female in no acute distress Neck- no lymphadenopathy, no thyromegaly, no jugular vein distension Cardiovascular- normal s1,s2, no murmurs/ rubs/ gallops; trace ankle edema noted Respiratory- bilateral clear to  auscultation, no wheeze, no rhonchi, no crackles, no use of accessory muscles Abdomen- bowel sounds present, soft, non tender, no organomegaly, no abdominal bruits,  Musculoskeletal- able to move all 4 extremities, walker/wheelchair assist Neurological- she is alert; 3/5 mm strength  Skin- warm and dry Psychiatry- alert and oriented to person; normal mood and affect     SIGNIFICANT DIAGNOSTIC EXAMS   12-29-12: chest x-ray: poor inspiration; minimal cardiomegaly no pulmonary vascular congestion or pleural effusion; interval resolution of patchy altelctasis or interstitial pneumonitis; has mild bronchitis  01-08-14: right hand x-ray: 1. On definite radiographic evidence of acute fracture or dislocation. 2.mild osteoporosis; 3. Moderate osteoarthritis.    LABS REVIEWED:    03-24-13: hgb a1c 7.3 06-24-13: wbc 4.7; hgb 14.3; hct 45.7; mcv 96.8 plt 217; glucose 138; bun 17; creat 0.7; k+4.6; na++135; liver normal albumin 3.2; chol 248; ldl 179 trig 176; hgb a1c 6.6  09-22-13: hgb a1c 7.0  12-22-13: wbc 5.3; hgb 1.4;1; hct 45.6; mcv 99.8; plt 185; glucose 142; bun 16; creat 0.7; k+4.4; na++137; liver normal albumin 3.2; chol 249; ldl 173; trig 167; hgb a1c 7.6; urine micro-albumin: 44.8  03-24-14: hgb a1c 7.6    Assessment/Plan  1. Hypertension: stable will continue; tenormin 25 mg daily and will monitor her status  2. Edema: stable; will continue lasix 20 mg daily with k+ 10 meq daily and will monitor   3. Constipation: no issues;  will continue senna s 2 tabs daily   4. Diabetes: her hgb a1c is 7.6; currently not on medications; will monitor her status.   5. Chronic pain: currently denies any pain; will continue roxanol 5 mg every 2 hours as needed and tylenol 65 mg twice daily and will monitor   6. Anxiety and depression: she is stable at this time; will continue her buspar 5 mg twice daily and will continue her ativan 0.5 mg nightly as needed and will monitor   7. Dementia: no issues; she  is end stage; no medications at this time; will not make changes will monitor her status    8. GERD: no issues; Will continue zantac 150 mg daily and will monitor her status.   Westley Foots, [place] NP [facility]  Contact [phone] Monday through Friday 8am- 5pm  After hours call [phone]

## 2014-04-12 DIAGNOSIS — E119 Type 2 diabetes mellitus without complications: Secondary | ICD-10-CM | POA: Insufficient documentation

## 2014-04-12 DIAGNOSIS — K5900 Constipation, unspecified: Secondary | ICD-10-CM | POA: Insufficient documentation

## 2014-05-04 ENCOUNTER — Encounter: Payer: Self-pay | Admitting: Internal Medicine

## 2014-05-04 ENCOUNTER — Non-Acute Institutional Stay (SKILLED_NURSING_FACILITY): Payer: Medicare Other | Admitting: Internal Medicine

## 2014-05-04 DIAGNOSIS — F419 Anxiety disorder, unspecified: Secondary | ICD-10-CM

## 2014-05-04 DIAGNOSIS — E119 Type 2 diabetes mellitus without complications: Secondary | ICD-10-CM

## 2014-05-04 DIAGNOSIS — R609 Edema, unspecified: Secondary | ICD-10-CM

## 2014-05-04 DIAGNOSIS — K219 Gastro-esophageal reflux disease without esophagitis: Secondary | ICD-10-CM

## 2014-05-04 DIAGNOSIS — F32A Depression, unspecified: Secondary | ICD-10-CM

## 2014-05-04 DIAGNOSIS — I10 Essential (primary) hypertension: Secondary | ICD-10-CM

## 2014-05-04 DIAGNOSIS — F329 Major depressive disorder, single episode, unspecified: Secondary | ICD-10-CM

## 2014-05-04 DIAGNOSIS — F418 Other specified anxiety disorders: Secondary | ICD-10-CM

## 2014-05-04 DIAGNOSIS — F0390 Unspecified dementia without behavioral disturbance: Secondary | ICD-10-CM

## 2014-05-04 NOTE — Progress Notes (Signed)
Patient ID: Debbie Rodriguez, female   DOB: 25-Oct-1924, 80 y.o.   MRN: [id number]   Place of Service: [facility]  Allergies  Allergen Reactions  . Penicillins     REACTION: Reaction not known  . Sulfonamide Derivatives     Code Status: DNR  Goals of Care: Comfort and Quality of Life/Long term care  Chief Complaint  Patient presents with  . Medical Management of Chronic Issues    dementia, HTN, edema, GERD, anxiety     HPI 80 y.o. female with PMH of dementia, HTN, edema, GERD, anxiety/depression among others is being seen for a routine visit. No complaints verbalized from patient or daughter. No recent falls or skin concerns. Weight stable. No change in behaviors or functional status. No concerns from staff.   Review of Systems Constitutional: Negative for fever and chills HENT: congestion and sore throat Eyes: Negative for eye pain, eye discharge, and visual disturbance  Cardiovascular: Negative for chest pain and leg swelling Respiratory: Negative cough and shortness of breath  Gastrointestinal: Negative for nausea and vomiting. Negative for abdominal pain Musculoskeletal: Negative for back pain, joint pain, and joint swelling  Neurological: Negative for dizziness and headache Skin: Negative for rash  Psychiatric: Has dementia at baseline  Past Medical History  Diagnosis Date  . DM2 (diabetes mellitus, type 2)   . CAD (coronary artery disease)     cath- 2007  . Alzheimer's dementia   . HLD (hyperlipidemia)   . HTN (hypertension)   . A-fib   . Breast cancer   . RENAL CALCULUS 01/22/2009    Qualifier: Diagnosis of  By: Burnett Kanaris    . BREAST CANCER 01/22/2009    Qualifier: Diagnosis of  By: Burnett Kanaris    . URINARY INCONTINENCE 04/18/2010    Qualifier: Diagnosis of  By: Earley Favor MD, Cat      Past Surgical History  Procedure Laterality Date  . Replacement total knee    . Abdominal hysterectomy      History   Social History  . Marital Status:  Widowed    Spouse Name: N/A    Number of Children: N/A  . Years of Education: N/A   Occupational History  . Not on file.   Social History Main Topics  . Smoking status: Never Smoker   . Smokeless tobacco: Current User    Types: Snuff  . Alcohol Use: No  . Drug Use: Not on file  . Sexual Activity: Not on file   Other Topics Concern  . Not on file   Social History Narrative      Medication List       This list is accurate as of: 05/04/14  1:41 PM.  Always use your most recent med list.               acetaminophen 325 MG tablet  Commonly known as:  TYLENOL  Take 650 mg by mouth 2 (two) times daily.     atenolol 25 MG tablet  Commonly known as:  TENORMIN  Take 25 mg by mouth daily.     busPIRone 5 MG tablet  Commonly known as:  BUSPAR  Take 5 mg by mouth 2 (two) times daily.     furosemide 20 MG tablet  Commonly known as:  LASIX  Take 20 mg by mouth daily.     potassium chloride 10 MEQ tablet  Commonly known as:  K-DUR  Take 10 mEq by mouth daily.     ranitidine  150 MG tablet  Commonly known as:  ZANTAC  Take 150 mg by mouth at bedtime.     sennosides-docusate sodium 8.6-50 MG tablet  Commonly known as:  SENOKOT-S  Take 2 tablets by mouth daily.        Physical Exam Filed Vitals:   05/04/14 1333  BP: 118/67  Pulse: 87  Temp: 98.3 F (36.8 C)  Resp: 20   Filed Weights   05/04/14 1333  Weight: 198 lb 8 oz (90.039 kg)   Constitutional: WDWN elderly female in no acute distress.  HEENT: Normocephalic and atraumatic. PERRL. EOM intact. No icterus. Oral mucosa moist. Posterior pharynx clear of any exudate or lesions. Teeth and gingiva in adequate general condition.  Neck: Supple and nontender. No lymphadenopathy, masses, or thyromegaly. No JVD or carotid bruits. Cardiac: Normal S1, S2. RRR without appreciable murmurs, rubs, or gallops. Distal pulses intact. Trace pitting leg edema bilaterally  Lungs: No respiratory distress. Breath sounds clear  bilaterally without rales, rhonchi, or wheezes. Abdomen: Audible bowel sounds in all quadrants. Soft, nontender, nondistended.   Musculoskeletal: Able to move all extremities. No joint erythema or tenderness.  Skin: Warm and dry. No rash noted. No erythema.  Neurological: Alert and oriented to person Psychiatric:  Appropriate mood and affect.   Labs Reviewed CBC Latest Ref Rng 12/22/2013 07/13/2012 07/12/2012  WBC - 5.3 6.3 6.4  Hemoglobin 12.0 - 16.0 g/dL 14.1 14.8 15.4(H)  Hematocrit 36 - 46 % 46 45.4 47.1(H)  Platelets 150 - 399 K/L 185 182 153    CMP     Component Value Date/Time   NA 137 12/22/2013   NA 140 07/13/2012 0450   K 4.4 12/22/2013   CL 101 07/13/2012 0450   CO2 26 07/13/2012 0450   GLUCOSE 103* 07/13/2012 0450   BUN 16 12/22/2013   BUN 20 07/13/2012 0450   CREATININE 0.7 12/22/2013   CREATININE 0.83 07/13/2012 0450   CALCIUM 10.3 07/13/2012 0450   PROT 7.1 07/12/2012 1338   ALBUMIN 3.6 07/12/2012 1338   AST 10* 12/22/2013   ALT 6* 12/22/2013   ALKPHOS 137* 12/22/2013   BILITOT 0.9 07/12/2012 1338   GFRNONAA 63* 07/13/2012 0450   GFRAA 72* 07/13/2012 0450    Lab Results  Component Value Date   HGBA1C 7.6* 03/24/2014    Assessment & Plan 1. Edema Stable. Continue lasix 20mg  daily with potassium supplement and monitor.   2. Essential hypertension, benign Stable. Continue lasix 20mg  and atenolol 25mg  daily. Continue to monitor  3. Gastroesophageal reflux disease without esophagitis Stable. Continue zantac 150mg  daily and monitor.  4. Anxiety and depression Stable. Continue buspirone 5mg  twice daily and monitor for change in mood.  5. Dementia, without behavioral disturbance Stable. Ongoing. Not on any medications for dementia. Continue to monitor for change in behaviors. Continue fall risk and pressure ulcer prophylaxis.   6. DM2 Last A1c 7.6. Goal a1c < 8. Currently not on any medications. Will check CBG twice a week and continue to monitor.     Family/Staff Communication Plan of care discuss with nursing staff. Nursing staff verbalize understanding and agree with plan of care. No additional questions or concerns reported.    Arthur Holms, MSN, AGNP-C [place], [place] [address] [phone] [8am-5pm] After hours: [phone]  I have personally reviewed this note and agree with the care plan  [facility], MD  [facility] [phone] (Monday-Friday 8 am - 5 pm) [phone] (afterhours)

## 2014-05-30 ENCOUNTER — Non-Acute Institutional Stay (SKILLED_NURSING_FACILITY): Payer: Medicare Other | Admitting: Registered Nurse

## 2014-05-30 DIAGNOSIS — I10 Essential (primary) hypertension: Secondary | ICD-10-CM

## 2014-05-30 DIAGNOSIS — F419 Anxiety disorder, unspecified: Secondary | ICD-10-CM

## 2014-05-30 DIAGNOSIS — F329 Major depressive disorder, single episode, unspecified: Secondary | ICD-10-CM

## 2014-05-30 DIAGNOSIS — F32A Depression, unspecified: Secondary | ICD-10-CM

## 2014-05-30 DIAGNOSIS — K5900 Constipation, unspecified: Secondary | ICD-10-CM

## 2014-05-30 DIAGNOSIS — F0390 Unspecified dementia without behavioral disturbance: Secondary | ICD-10-CM

## 2014-05-30 DIAGNOSIS — G8929 Other chronic pain: Secondary | ICD-10-CM

## 2014-05-30 DIAGNOSIS — R609 Edema, unspecified: Secondary | ICD-10-CM

## 2014-05-30 DIAGNOSIS — K219 Gastro-esophageal reflux disease without esophagitis: Secondary | ICD-10-CM

## 2014-05-30 DIAGNOSIS — E119 Type 2 diabetes mellitus without complications: Secondary | ICD-10-CM

## 2014-05-30 DIAGNOSIS — F418 Other specified anxiety disorders: Secondary | ICD-10-CM

## 2014-05-30 NOTE — Progress Notes (Signed)
Patient ID: Debbie Rodriguez, female   DOB: Sep 28, 1924, 80 y.o.   MRN: [id number]   Place of Service: [facility]  Allergies  Allergen Reactions  . Penicillins     REACTION: Reaction not known  . Sulfonamide Derivatives     Code Status: DNR  Goals of Care: Comfort and Quality of Life/LTC  Chief Complaint  Patient presents with  . Medical Management of Chronic Issues    HTN, GERD, dementia, anxiety, edema, constipation, chronic pain    HPI 80 y.o. female with PMH of HTN, GERD, DM2, dementia, edema, anxiety, chronic pain, constipation among others is being seen for a routine visit. Weight stable. No recent fall or skin concerns reported. No functional status reported. Per nursing staff, patient is not being herself today, she's more quiet than usual and more lethargic. Otherwise, no concerns from staff. HTN is stable on atenolol, anxiety is stable on buspar, and chronic pain is stable on scheduled tylenol  Review of Systems Constitutional: Negative for fever and chills HENT: Negative for congestion  and sore throat Eyes: Negative for eye pain, eye discharge, and visual disturbance  Cardiovascular: Negative for chest pain Respiratory: Negative cough and shortness of breath Gastrointestinal: Negative for nausea and vomiting. Negative for abdominal pain Musculoskeletal: Negative for back pain Neurological: Negative for dizziness and headache  Skin: Negative for rash.   Psychiatric: Negative for depression  Past Medical History  Diagnosis Date  . DM2 (diabetes mellitus, type 2)   . CAD (coronary artery disease)     cath- 2007  . Alzheimer's dementia   . HLD (hyperlipidemia)   . HTN (hypertension)   . A-fib   . Breast cancer   . RENAL CALCULUS 01/22/2009    Qualifier: Diagnosis of  By: Burnett Kanaris    . BREAST CANCER 01/22/2009    Qualifier: Diagnosis of  By: Burnett Kanaris    . URINARY INCONTINENCE 04/18/2010    Qualifier: Diagnosis of  By: Earley Favor MD, Cat       Past Surgical History  Procedure Laterality Date  . Replacement total knee    . Abdominal hysterectomy      History   Social History  . Marital Status: Widowed    Spouse Name: N/A    Number of Children: N/A  . Years of Education: N/A   Occupational History  . Not on file.   Social History Main Topics  . Smoking status: Never Smoker   . Smokeless tobacco: Current User    Types: Snuff  . Alcohol Use: No  . Drug Use: Not on file  . Sexual Activity: Not on file   Other Topics Concern  . Not on file   Social History Narrative    Family History  Problem Relation Age of Onset  . Breast cancer Sister   . Throat cancer Sister       Medication List       This list is accurate as of: 05/30/14  7:36 PM.  Always use your most recent med list.               acetaminophen 325 MG tablet  Commonly known as:  TYLENOL  Take 650 mg by mouth 2 (two) times daily.     atenolol 25 MG tablet  Commonly known as:  TENORMIN  Take 25 mg by mouth daily.     busPIRone 5 MG tablet  Commonly known as:  BUSPAR  Take 5 mg by mouth 2 (two) times daily.  furosemide 20 MG tablet  Commonly known as:  LASIX  Take 20 mg by mouth daily.     potassium chloride 10 MEQ tablet  Commonly known as:  K-DUR  Take 10 mEq by mouth daily.     ranitidine 150 MG tablet  Commonly known as:  ZANTAC  Take 150 mg by mouth at bedtime.     sennosides-docusate sodium 8.6-50 MG tablet  Commonly known as:  SENOKOT-S  Take 2 tablets by mouth daily.        Physical Exam  BP 119/79 mmHg  Pulse 100  Temp(Src) 96.5 F (35.8 C)  Resp 20  Ht 5' (1.524 m)  Wt 201 lb (91.173 kg)  BMI 39.26 kg/m2  SpO2 95%  Constitutional: WDWN elderly female in no acute distress. Conversant. Looks sleepy HEENT: Normocephalic and atraumatic. PERRL. EOM intact. No icterus.No nasal discharge or sinus tenderness. Oral mucosa dry. Posterior pharynx clear of any exudate or lesions. Poor dentition Neck: Supple  and nontender. No lymphadenopathy, masses, or thyromegaly. No JVD or carotid bruits. Cardiac: Normal S1, S2. RRR without appreciable murmurs, rubs, or gallops. Distal pulses intact. Trace pitting edema of BLE Lungs: No respiratory distress. Breath sounds coarse bilaterally without rales, rhonchi, or wheezes. Abdomen: Audible bowel sounds in all quadrants. Soft, nontender, nondistended.  Musculoskeletal: able to move all extremities. Skin: Warm and dry. No rash noted. No erythema.  Neurological: Alert/Arousable Psychiatric: Appropriate mood and affect.   Labs Reviewed  CBC Latest Ref Rng 12/22/2013 07/13/2012 07/12/2012  WBC - 5.3 6.3 6.4  Hemoglobin 12.0 - 16.0 g/dL 14.1 14.8 15.4(H)  Hematocrit 36 - 46 % 46 45.4 47.1(H)  Platelets 150 - 399 K/L 185 182 153    CMP Latest Ref Rng 12/22/2013 07/13/2012 07/12/2012  Glucose 70 - 99 mg/dL - 103(H) 141(H)  BUN 4 - 21 mg/dL 16 20 22   Creatinine 0.5 - 1.1 mg/dL 0.7 0.83 0.91  Sodium 137 - 147 mmol/L 137 140 139  Potassium 3.4 - 5.3 mmol/L 4.4 4.2 4.2  Chloride 96 - 112 mEq/L - 101 101  CO2 19 - 32 mEq/L - 26 26  Calcium 8.4 - 10.5 mg/dL - 10.3 10.3  Total Protein 6.0 - 8.3 g/dL - - 7.1  Total Bilirubin 0.3 - 1.2 mg/dL - - 0.9  Alkaline Phos 25 - 125 U/L 137(A) - 71  AST 13 - 35 U/L 10(A) - 17  ALT 7 - 35 U/L 6(A) - 13    Lab Results  Component Value Date   HGBA1C 7.6* 03/24/2014    Lab Results  Component Value Date   TSH 2.704 04/18/2010    Lipid Panel     Component Value Date/Time   CHOL 249* 12/22/2013   TRIG 167* 12/22/2013   HDL 48.5 08/17/2006 0952   CHOLHDL 4.1 CALC 08/17/2006 0952   VLDL 33 08/17/2006 0952   LDLCALC 173 12/22/2013    Assessment & Plan 1. Essential hypertension, benign Stable. Continue atenolol 25mg  daily and monitor  2. Gastroesophageal reflux disease, esophagitis presence not specified No issues. Continue zantac 150mg  daily at bedtime and montior  3. Dementia without behavioral  disturbance Advanced stage. Not on any medications. Continue to monitor for change in behaviors  4. Chronic pain Stable. Continue tylenol 650mg  twice daily and monitor  5. Edema Stable. Continue lasix 20mg  daily with potassium 59mEq daily supplement. Continue to monitor  6. Constipation, unspecified constipation type Stable. Continue senna 2 tabs daily and monitor  7. Anxiety and depression Stable. Continue  buspar 5mg  twice daily and monitor for change in mood.  8. Diabetes mellitus type 2, uncomplicated Most recent T3M 7.6. Currently not on medications. Due to advanced age, will continue to monitor for now.    Diagnostic Studies/Labs Ordered: CBC with diff and CMP  Family/Staff Communication Plan of care discussed with nursing staff. Nursing staff verbalized understanding and agree with plan of care. No additional questions or concerns reported.    Arthur Holms, MSN, AGNP-C [facility] [address] [place], [place] [address] [phone] [8am-5pm] After hours: [phone]

## 2014-06-26 LAB — BASIC METABOLIC PANEL
BUN: 18 mg/dL (ref 4–21)
CREATININE: 0.7 mg/dL (ref 0.5–1.1)
Glucose: 150 mg/dL
Potassium: 4.4 mmol/L (ref 3.4–5.3)
Sodium: 139 mmol/L (ref 137–147)

## 2014-06-26 LAB — LIPID PANEL
Cholesterol: 279 mg/dL — AB (ref 0–200)
HDL: 47 mg/dL (ref 35–70)
LDL CALC: 200 mg/dL
TRIGLYCERIDES: 159 mg/dL (ref 40–160)

## 2014-06-26 LAB — CBC AND DIFFERENTIAL
HCT: 49 % — AB (ref 36–46)
Hemoglobin: 15.9 g/dL (ref 12.0–16.0)
PLATELETS: 178 10*3/uL (ref 150–399)
WBC: 6.9 10^3/mL

## 2014-06-26 LAB — HEMOGLOBIN A1C: Hgb A1c MFr Bld: 8 % — AB (ref 4.0–6.0)

## 2014-06-29 ENCOUNTER — Encounter: Payer: Self-pay | Admitting: Registered Nurse

## 2014-06-29 ENCOUNTER — Non-Acute Institutional Stay (SKILLED_NURSING_FACILITY): Payer: Medicare Other | Admitting: Registered Nurse

## 2014-06-29 DIAGNOSIS — F0390 Unspecified dementia without behavioral disturbance: Secondary | ICD-10-CM

## 2014-06-29 DIAGNOSIS — Z5320 Procedure and treatment not carried out because of patient's decision for unspecified reasons: Secondary | ICD-10-CM

## 2014-06-29 DIAGNOSIS — F329 Major depressive disorder, single episode, unspecified: Secondary | ICD-10-CM

## 2014-06-29 DIAGNOSIS — E785 Hyperlipidemia, unspecified: Secondary | ICD-10-CM

## 2014-06-29 DIAGNOSIS — R609 Edema, unspecified: Secondary | ICD-10-CM

## 2014-06-29 DIAGNOSIS — K5900 Constipation, unspecified: Secondary | ICD-10-CM

## 2014-06-29 DIAGNOSIS — F418 Other specified anxiety disorders: Secondary | ICD-10-CM

## 2014-06-29 DIAGNOSIS — I10 Essential (primary) hypertension: Secondary | ICD-10-CM

## 2014-06-29 DIAGNOSIS — G8929 Other chronic pain: Secondary | ICD-10-CM

## 2014-06-29 DIAGNOSIS — Z9114 Patient's other noncompliance with medication regimen: Secondary | ICD-10-CM

## 2014-06-29 DIAGNOSIS — F419 Anxiety disorder, unspecified: Secondary | ICD-10-CM

## 2014-06-29 DIAGNOSIS — K219 Gastro-esophageal reflux disease without esophagitis: Secondary | ICD-10-CM

## 2014-06-29 DIAGNOSIS — E119 Type 2 diabetes mellitus without complications: Secondary | ICD-10-CM

## 2014-06-29 NOTE — Progress Notes (Signed)
Patient ID: Debbie Rodriguez, female   DOB: 11-15-1924, 81 y.o.   MRN: [id number]   Place of Service: [facility]  Allergies  Allergen Reactions  . Penicillins     REACTION: Reaction not known  . Sulfonamide Derivatives     Code Status: DNR  Goals of Care: Comfort and Quality of Life/LTC  Chief Complaint  Patient presents with  . Medical Management of Chronic Issues    dementia, HTN, depression w/ anxiety, GERD, HLD, DM2    HPI 81 y.o. female with PMH of HTN, GERD, DM2, dementia, edema, anxiety, chronic pain, constipation among others is being seen for a routine visit for management of her chronic issues. Weight overall stable-has lost about 6lbs since last routine visit. No recent fall or skin concerns reported. No change functional status or behaviors reported. Per dayshift nursing staff, patient has been refusing her medications on and off, but has been refusing them consistently for the past week. However, she has been taking her evening medications. Per daughter, Rosaria Ferries: patient would only take meds from certain people as it depends on how they approach her and it requires a lot of patience. Seen in wheelchair today. Unable to participate in HPI and ROS but appears comfortable and in no distress. HTN is stable, BP mostly in 120s-130s/60s-70s. Constipation is stable on senna. Currently not on meds for DM2. Recent A1c increased from 7.6 to 8.0. Mood stable on buspar. Pan is adequately controlled with scheduled tylenol.   Review of Systems Unable to obtain due to dementia  Past Medical History  Diagnosis Date  . DM2 (diabetes mellitus, type 2)   . CAD (coronary artery disease)     cath- 2007  . Alzheimer's dementia   . HLD (hyperlipidemia)   . HTN (hypertension)   . A-fib   . Breast cancer   . RENAL CALCULUS 01/22/2009    Qualifier: Diagnosis of  By: Burnett Kanaris    . BREAST CANCER 01/22/2009    Qualifier: Diagnosis of  By: Burnett Kanaris    . URINARY  INCONTINENCE 04/18/2010    Qualifier: Diagnosis of  By: Earley Favor MD, Cat      Past Surgical History  Procedure Laterality Date  . Replacement total knee    . Abdominal hysterectomy      History   Social History  . Marital Status: Widowed    Spouse Name: N/A    Number of Children: N/A  . Years of Education: N/A   Occupational History  . Not on file.   Social History Main Topics  . Smoking status: Never Smoker   . Smokeless tobacco: Current User    Types: Snuff  . Alcohol Use: No  . Drug Use: Not on file  . Sexual Activity: Not on file   Other Topics Concern  . Not on file   Social History Narrative    Family History  Problem Relation Age of Onset  . Breast cancer Sister   . Throat cancer Sister       Medication List       This list is accurate as of: 06/29/14  4:04 PM.  Always use your most recent med list.               acetaminophen 325 MG tablet  Commonly known as:  TYLENOL  Take 650 mg by mouth 2 (two) times daily.     atenolol 25 MG tablet  Commonly known as:  TENORMIN  Take 25 mg by mouth  daily.     busPIRone 5 MG tablet  Commonly known as:  BUSPAR  Take 5 mg by mouth 2 (two) times daily.     furosemide 20 MG tablet  Commonly known as:  LASIX  Take 20 mg by mouth daily.     potassium chloride 10 MEQ tablet  Commonly known as:  K-DUR  Take 10 mEq by mouth daily.     ranitidine 150 MG tablet  Commonly known as:  ZANTAC  Take 150 mg by mouth at bedtime.     sennosides-docusate sodium 8.6-50 MG tablet  Commonly known as:  SENOKOT-S  Take 2 tablets by mouth daily.        Physical Exam  BP 122/64 mmHg  Pulse 72  Temp(Src) 98.1 F (36.7 C)  Resp 20  Ht 5' (1.524 m)  Wt 195 lb 3.2 oz (88.542 kg)  BMI 38.12 kg/m2  SpO2 98%  Constitutional: WDWN elderly female in no acute distress. Conversant. Looks sleepy HEENT: Normocephalic and atraumatic. PERRL. EOM intact. No icterus.No nasal discharge or sinus tenderness. Oral mucosa dry.  Posterior pharynx clear of any exudate or lesions. Poor dentition Neck: Supple and nontender. No lymphadenopathy, masses, or thyromegaly. No JVD or carotid bruits. Cardiac: Normal S1, S2. RRR without appreciable murmurs, rubs, or gallops. Distal pulses intact. Trace pitting edema of BLE Lungs: No respiratory distress. Breath sounds coarse bilaterally without rales, rhonchi, or wheezes. Abdomen: Audible bowel sounds in all quadrants. Soft, nontender, nondistended.  Musculoskeletal: able to move all extremities. Skin: Warm and dry. No rash noted. No erythema.  Neurological: Alert/Arousable Psychiatric: Appropriate mood and affect.   Labs Reviewed  CBC Latest Ref Rng 06/26/2014 12/22/2013 07/13/2012  WBC - 6.9 5.3 6.3  Hemoglobin 12.0 - 16.0 g/dL 15.9 14.1 14.8  Hematocrit 36 - 46 % 49(A) 46 45.4  Platelets 150 - 399 K/L 178 185 182    CMP Latest Ref Rng 06/26/2014 12/22/2013 07/13/2012  Glucose 70 - 99 mg/dL - - 103(H)  BUN 4 - 21 mg/dL 18 16 20   Creatinine 0.5 - 1.1 mg/dL 0.7 0.7 0.83  Sodium 137 - 147 mmol/L 139 137 140  Potassium 3.4 - 5.3 mmol/L 4.4 4.4 4.2  Chloride 96 - 112 mEq/L - - 101  CO2 19 - 32 mEq/L - - 26  Calcium 8.4 - 10.5 mg/dL - - 10.3  Total Protein 6.0 - 8.3 g/dL - - -  Total Bilirubin 0.3 - 1.2 mg/dL - - -  Alkaline Phos 25 - 125 U/L - 137(A) -  AST 13 - 35 U/L - 10(A) -  ALT 7 - 35 U/L - 6(A) -    Lab Results  Component Value Date   HGBA1C 8.0* 06/26/2014    Lipid Panel     Component Value Date/Time   CHOL 279* 06/26/2014   TRIG 159 06/26/2014   HDL 47 06/26/2014   CHOLHDL 4.1 CALC 08/17/2006 0952   VLDL 33 08/17/2006 0952   LDLCALC 200 06/26/2014    Assessment & Plan 1. Essential hypertension, benign Stable. Continue atenolol 25mg  daily and monitor  2. Gastroesophageal reflux disease, esophagitis presence not specified No issues. Continue zantac 150mg  daily at bedtime and montior  3. Dementia without behavioral disturbance Advanced stage. Not on  any medications. Continue to monitor for change in behaviors. Fall risk and pressure ulcer precautions.   4. Chronic pain Stable. Continue tylenol 650mg  twice daily and monitor  5. Edema Stable-only trace edema of BLE on exam. Continue lasix 20mg  daily  with potassium 58mEq daily and continue to monitor  6. Constipation, unspecified constipation type Stable. Continue senna 2 tabs daily and monitor. Encourage hydration.   7. Anxiety and depression Stable. Continue buspar 5mg  twice daily and monitor for change in mood.  8. Diabetes mellitus type 2, uncomplicated Most recent P9Y 8.0. Currently not on medications. Daughter notified about elevated a1c. Will not start medication at this time. Continue to monitor for now.   9. HLD LDL 200. Will not start statin per family request due to advanced age and severe dementia. Continue to monitor for now.   10. Refusal of medications Family notified of situation. Will have evening nurse to try giving patients all her medications (except lasix) to see if there's any success-as she usually takes her meds for during evening's shift. Continue to monitor her status.   Family/Staff Communication Plan of care discussed with daughter, Rosaria Ferries and Engineer, civil (consulting). Daughter and nursing staff verbalized understanding and agree with plan of care. No additional questions or concerns reported.    Arthur Holms, MSN, AGNP-C [facility] [address] [place], [place] [address] [phone] [8am-5pm] After hours: [phone]

## 2014-07-17 ENCOUNTER — Ambulatory Visit: Payer: Self-pay

## 2014-07-17 ENCOUNTER — Non-Acute Institutional Stay (SKILLED_NURSING_FACILITY): Payer: Medicare Other | Admitting: Registered Nurse

## 2014-07-17 DIAGNOSIS — J189 Pneumonia, unspecified organism: Secondary | ICD-10-CM

## 2014-07-17 DIAGNOSIS — R0689 Other abnormalities of breathing: Secondary | ICD-10-CM

## 2014-07-17 NOTE — Progress Notes (Signed)
Patient ID: Debbie Rodriguez, female   DOB: [birth year], 81 y.o.   MRN: [id number]   Place of Service: [facility]  Allergies  Allergen Reactions  . Penicillins     REACTION: Reaction not known  . Sulfonamide Derivatives     Code Status: DNR  Goals of Care: Comfort and Quality of Life/LTC  Chief Complaint  Patient presents with  . Acute Visit    possible pna    HPI 81 y.o. female with PMH of HTN, GERD, DM2, dementia, edema, anxiety, chronic pain, constipation among others is being seen for an acute visit for at the request of nursing staff for the evaluation of possible pneumonia. Per nursing staff, symptoms started with cough and congestion on 07/14/14, but is worsening. Patient is more lethargic with adventitious lung sounds throughout all lung fields. Seen in room today. Daughter at bedside.   Review of Systems Unable to obtain due to dementia  Past Medical History  Diagnosis Date  . DM2 (diabetes mellitus, type 2)   . CAD (coronary artery disease)     cath- 2007  . Alzheimer's dementia   . HLD (hyperlipidemia)   . HTN (hypertension)   . A-fib   . Breast cancer   . RENAL CALCULUS 01/22/2009    Qualifier: Diagnosis of  By: Burnett Kanaris    . BREAST CANCER 01/22/2009    Qualifier: Diagnosis of  By: Burnett Kanaris    . URINARY INCONTINENCE 04/18/2010    Qualifier: Diagnosis of  By: Earley Favor MD, Cat      Past Surgical History  Procedure Laterality Date  . Replacement total knee    . Abdominal hysterectomy      History   Social History  . Marital Status: Widowed    Spouse Name: N/A    Number of Children: N/A  . Years of Education: N/A   Occupational History  . Not on file.   Social History Main Topics  . Smoking status: Never Smoker   . Smokeless tobacco: Current User    Types: Snuff  . Alcohol Use: No  . Drug Use: Not on file  . Sexual Activity: Not on file   Other Topics Concern  . Not on file   Social History Narrative    Family History    Problem Relation Age of Onset  . Breast cancer Sister   . Throat cancer Sister       Medication List       This list is accurate as of: 07/17/14  6:20 PM.  Always use your most recent med list.               acetaminophen 325 MG tablet  Commonly known as:  TYLENOL  Take 650 mg by mouth 2 (two) times daily.     atenolol 25 MG tablet  Commonly known as:  TENORMIN  Take 25 mg by mouth daily.     busPIRone 5 MG tablet  Commonly known as:  BUSPAR  Take 5 mg by mouth at bedtime.     furosemide 20 MG tablet  Commonly known as:  LASIX  Take 20 mg by mouth daily.     potassium chloride 10 MEQ tablet  Commonly known as:  K-DUR  Take 10 mEq by mouth daily.     ranitidine 150 MG tablet  Commonly known as:  ZANTAC  Take 150 mg by mouth at bedtime.     sennosides-docusate sodium 8.6-50 MG tablet  Commonly known as:  SENOKOT-S  Take 2 tablets by mouth daily.        Physical Exam  BP 128/78 mmHg  Pulse 82  Temp(Src) 97.4 F (36.3 C)  Resp 26  Ht 5' (1.524 m)  Wt 195 lb 3.2 oz (88.542 kg)  BMI 38.12 kg/m2  SpO2 95%  Constitutional: WDWN elderly female in moderate distress. Ill-appearing.  HEENT: Normocephalic and atraumatic. PERRL. EOM intact. No icterus. Neck: Supple and nontender. No lymphadenopathy, masses, or thyromegaly.  Cardiac: Normal S1, S2. RRR without appreciable murmurs, rubs, or gallops. Distal pulses intact. Trace pitting edema of BLE Lungs: Tachypneic. Breath sounds diminished with coarse crackles throughout lung fields Abdomen: Audible bowel sounds in all quadrants. Soft, nontender, nondistended.  Musculoskeletal: able to move all extremities. Skin: Warm and dry. No rash noted.  Neurological: Lethargic but arousable Psychiatric: has dementia at baseline  Labs Reviewed  CBC Latest Ref Rng 06/26/2014 12/22/2013 07/13/2012  WBC - 6.9 5.3 6.3  Hemoglobin 12.0 - 16.0 g/dL 15.9 14.1 14.8  Hematocrit 36 - 46 % 49(A) 46 45.4  Platelets 150 - 399 K/L 178  185 182    CMP Latest Ref Rng 06/26/2014 12/22/2013 07/13/2012  Glucose 70 - 99 mg/dL - - 103(H)  BUN 4 - 21 mg/dL 18 16 20   Creatinine 0.5 - 1.1 mg/dL 0.7 0.7 0.83  Sodium 137 - 147 mmol/L 139 137 140  Potassium 3.4 - 5.3 mmol/L 4.4 4.4 4.2  Chloride 96 - 112 mEq/L - - 101  CO2 19 - 32 mEq/L - - 26  Calcium 8.4 - 10.5 mg/dL - - 10.3  Total Protein 6.0 - 8.3 g/dL - - -  Total Bilirubin 0.3 - 1.2 mg/dL - - -  Alkaline Phos 25 - 125 U/L - 137(A) -  AST 13 - 35 U/L - 10(A) -  ALT 7 - 35 U/L - 6(A) -    Lab Results  Component Value Date   HGBA1C 8.0* 06/26/2014    Lipid Panel     Component Value Date/Time   CHOL 279* 06/26/2014   TRIG 159 06/26/2014   HDL 47 06/26/2014   CHOLHDL 4.1 CALC 08/17/2006 0952   VLDL 33 08/17/2006 0952   LDLCALC 200 06/26/2014    Assessment & Plan 1. Pneumonia symptoms STAT CXR and CBC with diff. Rocephin 1g IM x 1 with z pak x 5days (500 mg on day 1 followed by 250 mg once daily on day 2-5). Start duoneb every six hours x 3 days then every six hours as needed. Encourage hydration and continue to monitor her status.   Family/Staff Communication Plan of care discussed with daughter, Rosaria Ferries and Engineer, civil (consulting). Daughter and nursing staff verbalized understanding and agree with plan of care. No additional questions or concerns reported.    Arthur Holms, MSN, AGNP-C [facility] [address] [place], [place] [address] [phone] [8am-5pm] After hours: [phone]

## 2014-07-18 ENCOUNTER — Emergency Department (HOSPITAL_COMMUNITY): Payer: Medicare Other

## 2014-07-18 ENCOUNTER — Inpatient Hospital Stay (HOSPITAL_COMMUNITY)
Admission: EM | Admit: 2014-07-18 | Discharge: 2014-07-24 | DRG: 871 | Disposition: E | Payer: Medicare Other | Attending: Internal Medicine | Admitting: Internal Medicine

## 2014-07-18 ENCOUNTER — Non-Acute Institutional Stay (SKILLED_NURSING_FACILITY): Payer: Medicare Other | Admitting: Registered Nurse

## 2014-07-18 ENCOUNTER — Encounter (HOSPITAL_COMMUNITY): Payer: Self-pay

## 2014-07-18 ENCOUNTER — Other Ambulatory Visit: Payer: Self-pay | Admitting: *Deleted

## 2014-07-18 DIAGNOSIS — I2510 Atherosclerotic heart disease of native coronary artery without angina pectoris: Secondary | ICD-10-CM | POA: Diagnosis present

## 2014-07-18 DIAGNOSIS — Z853 Personal history of malignant neoplasm of breast: Secondary | ICD-10-CM

## 2014-07-18 DIAGNOSIS — F508 Other eating disorders: Secondary | ICD-10-CM | POA: Diagnosis present

## 2014-07-18 DIAGNOSIS — E785 Hyperlipidemia, unspecified: Secondary | ICD-10-CM | POA: Diagnosis present

## 2014-07-18 DIAGNOSIS — I10 Essential (primary) hypertension: Secondary | ICD-10-CM | POA: Diagnosis present

## 2014-07-18 DIAGNOSIS — E872 Acidosis, unspecified: Secondary | ICD-10-CM | POA: Diagnosis present

## 2014-07-18 DIAGNOSIS — E861 Hypovolemia: Secondary | ICD-10-CM | POA: Diagnosis present

## 2014-07-18 DIAGNOSIS — E860 Dehydration: Secondary | ICD-10-CM | POA: Diagnosis present

## 2014-07-18 DIAGNOSIS — F0280 Dementia in other diseases classified elsewhere without behavioral disturbance: Secondary | ICD-10-CM | POA: Diagnosis present

## 2014-07-18 DIAGNOSIS — I214 Non-ST elevation (NSTEMI) myocardial infarction: Secondary | ICD-10-CM

## 2014-07-18 DIAGNOSIS — J189 Pneumonia, unspecified organism: Secondary | ICD-10-CM | POA: Diagnosis present

## 2014-07-18 DIAGNOSIS — Y95 Nosocomial condition: Secondary | ICD-10-CM | POA: Diagnosis present

## 2014-07-18 DIAGNOSIS — Z96659 Presence of unspecified artificial knee joint: Secondary | ICD-10-CM | POA: Diagnosis present

## 2014-07-18 DIAGNOSIS — E119 Type 2 diabetes mellitus without complications: Secondary | ICD-10-CM | POA: Diagnosis present

## 2014-07-18 DIAGNOSIS — A419 Sepsis, unspecified organism: Principal | ICD-10-CM | POA: Diagnosis present

## 2014-07-18 DIAGNOSIS — G9340 Encephalopathy, unspecified: Secondary | ICD-10-CM | POA: Diagnosis present

## 2014-07-18 DIAGNOSIS — I4891 Unspecified atrial fibrillation: Secondary | ICD-10-CM | POA: Diagnosis present

## 2014-07-18 DIAGNOSIS — N179 Acute kidney failure, unspecified: Secondary | ICD-10-CM | POA: Diagnosis present

## 2014-07-18 DIAGNOSIS — F17220 Nicotine dependence, chewing tobacco, uncomplicated: Secondary | ICD-10-CM | POA: Diagnosis present

## 2014-07-18 DIAGNOSIS — E875 Hyperkalemia: Secondary | ICD-10-CM | POA: Diagnosis present

## 2014-07-18 DIAGNOSIS — Z7189 Other specified counseling: Secondary | ICD-10-CM

## 2014-07-18 DIAGNOSIS — Z880 Allergy status to penicillin: Secondary | ICD-10-CM | POA: Diagnosis not present

## 2014-07-18 DIAGNOSIS — Z515 Encounter for palliative care: Secondary | ICD-10-CM

## 2014-07-18 DIAGNOSIS — G309 Alzheimer's disease, unspecified: Secondary | ICD-10-CM | POA: Diagnosis present

## 2014-07-18 DIAGNOSIS — G92 Toxic encephalopathy: Secondary | ICD-10-CM | POA: Diagnosis present

## 2014-07-18 DIAGNOSIS — F0390 Unspecified dementia without behavioral disturbance: Secondary | ICD-10-CM | POA: Diagnosis present

## 2014-07-18 DIAGNOSIS — R509 Fever, unspecified: Secondary | ICD-10-CM

## 2014-07-18 DIAGNOSIS — Z803 Family history of malignant neoplasm of breast: Secondary | ICD-10-CM

## 2014-07-18 DIAGNOSIS — Z87442 Personal history of urinary calculi: Secondary | ICD-10-CM | POA: Diagnosis not present

## 2014-07-18 DIAGNOSIS — N19 Unspecified kidney failure: Secondary | ICD-10-CM | POA: Insufficient documentation

## 2014-07-18 DIAGNOSIS — Z66 Do not resuscitate: Secondary | ICD-10-CM | POA: Diagnosis present

## 2014-07-18 LAB — URINALYSIS, ROUTINE W REFLEX MICROSCOPIC
Glucose, UA: 100 mg/dL — AB
KETONES UR: 15 mg/dL — AB
Leukocytes, UA: NEGATIVE
Nitrite: NEGATIVE
Specific Gravity, Urine: 1.03 (ref 1.005–1.030)
UROBILINOGEN UA: 0.2 mg/dL (ref 0.0–1.0)
pH: 5 (ref 5.0–8.0)

## 2014-07-18 LAB — COMPREHENSIVE METABOLIC PANEL
ALBUMIN: 3.3 g/dL — AB (ref 3.5–5.2)
ALK PHOS: 73 U/L (ref 39–117)
ALT: 20 U/L (ref 0–35)
ANION GAP: 16 — AB (ref 5–15)
AST: 63 U/L — AB (ref 0–37)
BUN: 45 mg/dL — ABNORMAL HIGH (ref 6–23)
CALCIUM: 10.7 mg/dL — AB (ref 8.4–10.5)
CO2: 21 mmol/L (ref 19–32)
CREATININE: 3.16 mg/dL — AB (ref 0.50–1.10)
Chloride: 108 mmol/L (ref 96–112)
GFR calc non Af Amer: 12 mL/min — ABNORMAL LOW (ref >90)
GFR, EST AFRICAN AMERICAN: 14 mL/min — AB (ref >90)
Glucose, Bld: 374 mg/dL — ABNORMAL HIGH (ref 70–99)
POTASSIUM: 5.9 mmol/L — AB (ref 3.5–5.1)
SODIUM: 145 mmol/L (ref 135–145)
Total Bilirubin: 1.6 mg/dL — ABNORMAL HIGH (ref 0.3–1.2)
Total Protein: 7.1 g/dL (ref 6.0–8.3)

## 2014-07-18 LAB — CBC WITH DIFFERENTIAL/PLATELET
BASOS ABS: 0 10*3/uL (ref 0.0–0.1)
BASOS PCT: 0 % (ref 0–1)
Eosinophils Absolute: 0 10*3/uL (ref 0.0–0.7)
Eosinophils Relative: 0 % (ref 0–5)
HCT: 56.3 % — ABNORMAL HIGH (ref 36.0–46.0)
HEMOGLOBIN: 18.5 g/dL — AB (ref 12.0–15.0)
LYMPHS ABS: 0.4 10*3/uL — AB (ref 0.7–4.0)
LYMPHS PCT: 3 % — AB (ref 12–46)
MCH: 31.5 pg (ref 26.0–34.0)
MCHC: 32.9 g/dL (ref 30.0–36.0)
MCV: 95.9 fL (ref 78.0–100.0)
MONOS PCT: 8 % (ref 3–12)
Monocytes Absolute: 1.1 10*3/uL — ABNORMAL HIGH (ref 0.1–1.0)
Neutro Abs: 11.3 10*3/uL — ABNORMAL HIGH (ref 1.7–7.7)
Neutrophils Relative %: 88 % — ABNORMAL HIGH (ref 43–77)
Platelets: 189 10*3/uL (ref 150–400)
RBC: 5.87 MIL/uL — AB (ref 3.87–5.11)
RDW: 13.7 % (ref 11.5–15.5)
WBC: 12.8 10*3/uL — ABNORMAL HIGH (ref 4.0–10.5)

## 2014-07-18 LAB — URINE MICROSCOPIC-ADD ON

## 2014-07-18 LAB — I-STAT TROPONIN, ED: Troponin i, poc: 15.21 ng/mL (ref 0.00–0.08)

## 2014-07-18 LAB — CBG MONITORING, ED: GLUCOSE-CAPILLARY: 250 mg/dL — AB (ref 70–99)

## 2014-07-18 LAB — I-STAT CG4 LACTIC ACID, ED: Lactic Acid, Venous: 3.99 mmol/L (ref 0.5–2.0)

## 2014-07-18 MED ORDER — VANCOMYCIN HCL IN DEXTROSE 1-5 GM/200ML-% IV SOLN: 1000.0000 mg | INTRAVENOUS | Status: DC

## 2014-07-18 MED ORDER — ACETAMINOPHEN 650 MG RE SUPP: 650.0000 mg | Freq: Four times a day (QID) | RECTAL | Status: DC | PRN

## 2014-07-18 MED ORDER — DEXTROSE 5 % IV SOLN
1.0000 g | Freq: Three times a day (TID) | INTRAVENOUS | Status: DC
  Filled 2014-07-18 (×3): qty 1

## 2014-07-18 MED ORDER — AMBULATORY NON FORMULARY MEDICATION: Status: AC

## 2014-07-18 MED ORDER — ACETAMINOPHEN 650 MG RE SUPP
650.0000 mg | Freq: Once | RECTAL | Status: AC
  Administered 2014-07-18: 650 mg via RECTAL
  Filled 2014-07-18: qty 1

## 2014-07-18 MED ORDER — SODIUM CHLORIDE 0.9 % IV BOLUS (SEPSIS)
1000.0000 mL | Freq: Once | INTRAVENOUS | Status: AC
  Administered 2014-07-18: 1000 mL via INTRAVENOUS

## 2014-07-18 MED ORDER — ONDANSETRON HCL 4 MG/2ML IJ SOLN: 4.0000 mg | Freq: Four times a day (QID) | INTRAMUSCULAR | Status: DC | PRN

## 2014-07-18 MED ORDER — INSULIN ASPART 100 UNIT/ML ~~LOC~~ SOLN
10.0000 [IU] | Freq: Once | SUBCUTANEOUS | Status: AC
  Administered 2014-07-18: 10 [IU] via SUBCUTANEOUS
  Filled 2014-07-18: qty 1

## 2014-07-18 MED ORDER — SODIUM CHLORIDE 0.9 % IV SOLN: INTRAVENOUS | Status: DC

## 2014-07-18 MED ORDER — ONDANSETRON HCL 4 MG PO TABS: 4.0000 mg | ORAL_TABLET | Freq: Four times a day (QID) | ORAL | Status: DC | PRN

## 2014-07-18 MED ORDER — ACETAMINOPHEN 325 MG PO TABS: 650.0000 mg | ORAL_TABLET | Freq: Four times a day (QID) | ORAL | Status: DC | PRN

## 2014-07-18 MED ORDER — HEPARIN SODIUM (PORCINE) 5000 UNIT/ML IJ SOLN: 5000.0000 [IU] | Freq: Three times a day (TID) | INTRAMUSCULAR | Status: DC

## 2014-07-18 MED ORDER — VANCOMYCIN HCL 10 G IV SOLR
1500.0000 mg | Freq: Once | INTRAVENOUS | Status: AC
  Administered 2014-07-18: 1500 mg via INTRAVENOUS
  Filled 2014-07-18: qty 1500

## 2014-07-18 MED ORDER — DEXTROSE 5 % IV SOLN
1.0000 g | INTRAVENOUS | Status: DC
  Filled 2014-07-18: qty 1

## 2014-07-18 MED ORDER — SODIUM CHLORIDE 0.9 % IV SOLN
1.0000 g | Freq: Once | INTRAVENOUS | Status: AC
  Administered 2014-07-18: 1 g via INTRAVENOUS
  Filled 2014-07-18: qty 10

## 2014-07-18 MED ORDER — SODIUM CHLORIDE 0.9 % IV BOLUS (SEPSIS): 1000.0000 mL | Freq: Once | INTRAVENOUS | Status: DC

## 2014-07-24 NOTE — Progress Notes (Signed)
Patient ID: Debbie Rodriguez, female   DOB: 03-27-1925, 81 y.o.   MRN: [id number]   Place of Service: [facility]  Allergies  Allergen Reactions  . Penicillins     REACTION: Reaction not known  . Sulfonamide Derivatives     Code Status: DNR  Goals of Care: Comfort and Quality of Life/LTC  Chief Complaint  Patient presents with  . Acute Visit    elevated temp 105.3    HPI 81 y.o. female with PMH of HTN, GERD, DM2, dementia, edema, anxiety, chronic pain, constipation among others is being seen for an acute visit for elevated temporal temp of 105.3. She has rapidly declined since I saw her yesterday. I received a phone call from Nursing supervisor last night about her change in status-obtunded with tachycardia in the 140s. I had recommended for resident to be transferred to the ER for further evaluation. However, family did not want patient to be transferred to the hospital. She is a DNR and has MOST form with No hospitalization and IV fluids. VS around 6am 132/90, 83, 28, 96.7. Per first shift nurse, resident has a low grade temp of 100.7 and suppository tylenol was given. He rechecked her temp after tylenol was given and her new temporal temp was 105.3. Unable to obtain other VS.   Review of Systems Unable to obtain  Past Medical History  Diagnosis Date  . DM2 (diabetes mellitus, type 2)   . CAD (coronary artery disease)     cath- 2007  . Alzheimer's dementia   . HLD (hyperlipidemia)   . HTN (hypertension)   . A-fib   . Breast cancer   . RENAL CALCULUS 01/22/2009    Qualifier: Diagnosis of  By: Burnett Kanaris    . BREAST CANCER 01/22/2009    Qualifier: Diagnosis of  By: Burnett Kanaris    . URINARY INCONTINENCE 04/18/2010    Qualifier: Diagnosis of  By: Earley Favor MD, Cat      Past Surgical History  Procedure Laterality Date  . Replacement total knee    . Abdominal hysterectomy      History   Social History  . Marital Status: Widowed    Spouse Name: N/A   Number of Children: N/A  . Years of Education: N/A   Occupational History  . Not on file.   Social History Main Topics  . Smoking status: Never Smoker   . Smokeless tobacco: Current User    Types: Snuff  . Alcohol Use: No  . Drug Use: Not on file  . Sexual Activity: Not on file   Other Topics Concern  . Not on file   Social History Narrative    Family History  Problem Relation Age of Onset  . Breast cancer Sister   . Throat cancer Sister    Medications Reviewed in [facility]  Physical Exam  BP 132/90 mmHg  Pulse 83  Temp(Src) 96.7 F (35.9 C) (Temporal)  Resp 28  Constitutional: elderly female in severe distress. septic-appearing.  HEENT: PERRL. No icterus. Neck: No lymphadenopathy, masses, or thyromegaly.  Cardiac: Tachycardic. No appreciable murmurs, rubs, or gallops. Distal pulses intact. Trace pitting edema of BLE Lungs: Tachypneic. Breath sounds coarse with rhonchi bilaterally. Oxygen via [place] in place Abdomen: Audible bowel sounds in all quadrants. nondistended.  Musculoskeletal: flaccid Skin: Mottled. Extremities cool to touch. diaphoretic Neurological: obtunded.   Labs Reviewed  CBC Latest Ref Rng 2014/07/29 06/26/2014 12/22/2013  WBC 4.0 - 10.5 K/uL 12.8(H) 6.9 5.3  Hemoglobin 12.0 -  15.0 g/dL 18.5(H) 15.9 14.1  Hematocrit 36.0 - 46.0 % 56.3(H) 49(A) 46  Platelets 150 - 400 K/uL 189 178 185    CMP Latest Ref Rng 07-19-14 06/26/2014 12/22/2013  Glucose 70 - 99 mg/dL 374(H) - -  BUN 6 - 23 mg/dL 45(H) 18 16  Creatinine 0.50 - 1.10 mg/dL 3.16(H) 0.7 0.7  Sodium 135 - 145 mmol/L 145 139 137  Potassium 3.5 - 5.1 mmol/L 5.9(H) 4.4 4.4  Chloride 96 - 112 mmol/L 108 - -  CO2 19 - 32 mmol/L 21 - -  Calcium 8.4 - 10.5 mg/dL 10.7(H) - -  Total Protein 6.0 - 8.3 g/dL 7.1 - -  Total Bilirubin 0.3 - 1.2 mg/dL 1.6(H) - -  Alkaline Phos 39 - 117 U/L 73 - 137(A)  AST 0 - 37 U/L 63(H) - 10(A)  ALT 0 - 35 U/L 20 - 6(A)    Lab Results  Component Value Date   HGBA1C 8.0*  06/26/2014    Lipid Panel     Component Value Date/Time   CHOL 279* 06/26/2014   TRIG 159 06/26/2014   HDL 47 06/26/2014   CHOLHDL 4.1 CALC 08/17/2006 0952   VLDL 33 08/17/2006 0952   LDLCALC 200 06/26/2014    Assessment & Plan 1. Goals of care, counseling/discussion z-pak has been discontinued last night as patient was unable to take PO meds-Rocephin 1g daily x 7 days was ordered. Portable CXR from yesterday negative for focal pneumonia. Unsure of source of infection. Family aware of patient change in status and would like to keep her comfortable with IV hydration and medications as necessary through other routes. There were inconsistencies within the family resulting in indecisive direction for the patient. In the meantime, orders for hydration via hypodermoclysis (unable to obtain IV access), labs (cbc w/ diff, cmp), duoneb every four hours, atropine gtt every four hours, and roxanol every two hours were in place. Family (both daughters) finally decided to send patient to the hospital as they felt that her comfort is not being met here.   Time spent: >32mn on care coordination   KArthur Holms MSN, ASyr[facility] Surgery Center LLCPRanloGWellsville [place] [phone][8am-5pm] After hours: [phone]

## 2014-07-24 NOTE — ED Notes (Signed)
Family at bedside. 

## 2014-07-24 NOTE — ED Notes (Signed)
Lactic acid results given to Dr. Maryan Rued

## 2014-07-24 NOTE — Progress Notes (Signed)
Chaplain paged to visit [facility] family as pt expired.   Chaplains arrived, family expressed that they did not need support.   Family desired time alone to grieve and support one another.   Delford Field, Chaplain 2014-07-20

## 2014-07-24 NOTE — ED Provider Notes (Addendum)
CSN: [id number]     Arrival date & time 2014/07/20  1401 History   First MD Initiated Contact with Patient 07/20/2014 1407     Chief Complaint  Patient presents with  . Altered Mental Status     (Consider location/radiation/quality/duration/timing/severity/associated sxs/prior Treatment) Patient is a 81 y.o. female presenting with altered mental status. The history is provided by the patient.  Altered Mental Status Presenting symptoms: unresponsiveness   Severity:  Severe Most recent episode:  2 days ago Episode history:  Continuous Timing:  Constant Progression:  Worsening Chronicity:  New Context: dementia and nursing home resident   Context comment:  Daughter is at bedside and states that over the last 1 week patient has had nasal drainage and a cough and yesterday she became minimally responsive. They placed oxygen on her but daughter states nothing was being done to make her mom comfortable  Associated symptoms: decreased appetite, difficulty breathing, fever and weakness   Associated symptoms: no abdominal pain and no nausea   Associated symptoms comment:  Cough   Past Medical History  Diagnosis Date  . DM2 (diabetes mellitus, type 2)   . CAD (coronary artery disease)     cath- 2007  . Alzheimer's dementia   . HLD (hyperlipidemia)   . HTN (hypertension)   . A-fib   . Breast cancer   . RENAL CALCULUS 01/22/2009    Qualifier: Diagnosis of  By: Burnett Kanaris    . BREAST CANCER 01/22/2009    Qualifier: Diagnosis of  By: Burnett Kanaris    . URINARY INCONTINENCE 04/18/2010    Qualifier: Diagnosis of  By: Earley Favor MD, Cat     Past Surgical History  Procedure Laterality Date  . Replacement total knee    . Abdominal hysterectomy     Family History  Problem Relation Age of Onset  . Breast cancer Sister   . Throat cancer Sister    History  Substance Use Topics  . Smoking status: Never Smoker   . Smokeless tobacco: Current User    Types: Snuff  . Alcohol Use: No   OB  History    No data available     Review of Systems  Constitutional: Positive for fever, appetite change and decreased appetite.  Respiratory: Positive for cough.   Gastrointestinal: Negative for nausea and abdominal pain.  Neurological: Positive for weakness.  All other systems reviewed and are negative.     Allergies  Penicillins and Sulfonamide derivatives  Home Medications   Prior to Admission medications   Medication Sig Start Date End Date Taking? Authorizing Provider  acetaminophen (TYLENOL) 325 MG tablet Take 650 mg by mouth 2 (two) times daily.    Historical Provider, MD  atenolol (TENORMIN) 25 MG tablet Take 25 mg by mouth daily.      Historical Provider, MD  busPIRone (BUSPAR) 5 MG tablet Take 5 mg by mouth at bedtime.     Historical Provider, MD  furosemide (LASIX) 20 MG tablet Take 20 mg by mouth daily.    Historical Provider, MD  potassium chloride (K-DUR) 10 MEQ tablet Take 10 mEq by mouth daily.    Historical Provider, MD  ranitidine (ZANTAC) 150 MG tablet Take 150 mg by mouth at bedtime.    Historical Provider, MD  sennosides-docusate sodium (SENOKOT-S) 8.6-50 MG tablet Take 2 tablets by mouth daily.    Historical Provider, MD   BP 99/75 mmHg  Pulse 40  Temp(Src) 103.6 F (39.8 C) (Axillary)  Resp 37  SpO2 96% Physical Exam  Constitutional: She appears well-developed and well-nourished. No distress.  HENT:  Head: Normocephalic and atraumatic.  Mouth/Throat: Oropharynx is clear and moist. Mucous membranes are dry.  Eyes: Conjunctivae and EOM are normal. Pupils are equal, round, and reactive to light.  Neck: Normal range of motion. Neck supple.  Cardiovascular: Intact distal pulses.  An irregular rhythm present. Tachycardia present.   No murmur heard. Pulmonary/Chest: Effort normal. Tachypnea noted. No respiratory distress. She has no wheezes. She has rhonchi. She has rales.  Abdominal: Soft. She exhibits no distension. There is no tenderness. There is no  rebound and no guarding.  Musculoskeletal: Normal range of motion. She exhibits no edema or tenderness.  Cool distal extremities with thready pulse  Neurological: She is unresponsive.  Skin: Skin is warm and dry. No rash noted. No erythema.  Psychiatric: She has a normal mood and affect. Her behavior is normal.  Nursing note and vitals reviewed.   ED Course  Procedures (including critical care time) Labs Review Labs Reviewed  CBC WITH DIFFERENTIAL/PLATELET - Abnormal; Notable for the following:    WBC 12.8 (*)    RBC 5.87 (*)    Hemoglobin 18.5 (*)    HCT 56.3 (*)    Neutrophils Relative % 88 (*)    Neutro Abs 11.3 (*)    Lymphocytes Relative 3 (*)    Lymphs Abs 0.4 (*)    Monocytes Absolute 1.1 (*)    All other components within normal limits  COMPREHENSIVE METABOLIC PANEL - Abnormal; Notable for the following:    Potassium 5.9 (*)    Glucose, Bld 374 (*)    BUN 45 (*)    Creatinine, Ser 3.16 (*)    Calcium 10.7 (*)    Albumin 3.3 (*)    AST 63 (*)    Total Bilirubin 1.6 (*)    GFR calc non Af Amer 12 (*)    GFR calc Af Amer 14 (*)    Anion gap 16 (*)    All other components within normal limits  URINALYSIS, ROUTINE W REFLEX MICROSCOPIC - Abnormal; Notable for the following:    Color, Urine AMBER (*)    APPearance TURBID (*)    Glucose, UA 100 (*)    Hgb urine dipstick MODERATE (*)    Bilirubin Urine MODERATE (*)    Ketones, ur 15 (*)    Protein, ur >300 (*)    All other components within normal limits  URINE MICROSCOPIC-ADD ON - Abnormal; Notable for the following:    Squamous Epithelial / LPF FEW (*)    Bacteria, UA FEW (*)    Casts HYALINE CASTS (*)    Crystals CA OXALATE CRYSTALS (*)    All other components within normal limits  I-STAT TROPOININ, ED - Abnormal; Notable for the following:    Troponin i, poc 15.21 (*)    All other components within normal limits  I-STAT CG4 LACTIC ACID, ED - Abnormal; Notable for the following:    Lactic Acid, Venous 3.99  (*)    All other components within normal limits    Imaging Review Dg Chest Port 1 View  2014-07-29   CLINICAL DATA:  Difficulty breathing and fever for 2 days  EXAM: PORTABLE CHEST - 1 VIEW  COMPARISON:  July 12, 2012  FINDINGS: There is left lower lobe airspace consolidation with a minimal left effusion. Lungs elsewhere clear. Heart is mildly enlarged with pulmonary vascularity within normal limits. No adenopathy. There is atherosclerotic change in the aorta. There is an  old lateral right sixth rib fracture, stable.  IMPRESSION: Left lower lobe consolidation with minimal left effusion. Lungs otherwise clear. Heart prominent but stable.   Electronically Signed   By: Lowella Grip M.D.   On: 07/25/14 15:06     EKG Interpretation   Date/Time:  July 25, 2014 14:04:03 EST Ventricular Rate:  138 PR Interval:  92 QRS Duration: 152 QT Interval:  337 QTC Calculation: 511 R Axis:   -109 Text Interpretation:  new Extreme tachycardia with wide complex, no  further rhythm analysis attempted Right bundle branch block Baseline  wander in lead(s) V2 Confirmed by Maryan Rued  MD, Loree Fee ([id number]) on  July 25, 2014 2:40:44 PM      MDM   Final diagnoses:  Fever  Sepsis, due to unspecified organism  HCAP (healthcare-associated pneumonia)  Non-ST elevation (NSTEMI) myocardial infarction  Acute renal failure, unspecified acute renal failure type  Hyperkalemia    Patient presents from the nursing home with evidence of florid sepsis, intermittent V. tach and decreased responsiveness. Daughter power of attorney is at bedside and reiterates that she is a DO NOT RESUSCITATE DO NOT INTUBATE and needs comfort measures. However daughter wants her to be evaluated to see whether her infection is coming from and feels that her mom would be okay with a trial of antibiotics for 24 hours to see if she has any improvement. Patient currently is not responding and has a temperature of 103.6. She has  intermittent tachycardia and bradycardia. Her troponin today is 15 as well as a lactic acid of 3.9. Chest x-ray with a left lower lobe consolidation and patient was started on vancomycin and aztreonam due to pcn allergy. Slight blood cell count is 12,000 with a hemoglobin of 18,000. CMP is pending.  4:08 PM Patient also found to be in acute renal failure with hyperkalemia which is most likely the reason for her widened QRS. She was given calcium gluconate. Patient will be admitted for further care  CRITICAL CARE Performed by: Blanchie Dessert Total critical care time: 30 Critical care time was exclusive of separately billable procedures and treating other patients. Critical care was necessary to treat or prevent imminent or life-threatening deterioration. Critical care was time spent personally by me on the following activities: development of treatment plan with patient and/or surrogate as well as nursing, discussions with consultants, evaluation of patient's response to treatment, examination of patient, obtaining history from patient or surrogate, ordering and performing treatments and interventions, ordering and review of laboratory studies, ordering and review of radiographic studies, pulse oximetry and re-evaluation of patient's condition.     Blanchie Dessert, MD July 25, 2014 1608  Blanchie Dessert, MD 07-25-14 [phone]

## 2014-07-24 NOTE — H&P (Signed)
[facility] History and Physical  IVAL BASQUEZ LZJ:[id number] DOB: 1924/12/21 DOA: 07/25/2014  Referring physician:  PCP: Blanchie Serve, MD   Chief Complaint: Mental Status Changes  HPI: Debbie Rodriguez is a 81 y.o. female with a past medical history of cognitive impairment, hypertension, type 2 diabetes mellitus currently nursing home patient, who was transferred to the emergency department today for mental status changes. History was obtained from her daughter was present at bedside and emergency room staff. Patient is obtunded and unable to provide history or participate in her own plan of care. Her daughter reported that patient had a decline in function approximate 10 days ago, becoming progressively weaker, less interactive, having minimal by mouth intake, and lethargic. During this time she did not have cough, shortness of breath, or sputum production. She had a steep decline as she was found to be minimally responsive by nursing staff last Sunday as her daughter reports oral antibiotics were prescribed. She fail to improve remaining lethargic, not taking by mouth intake. In the emergency room she was found to be septic having a rectal temperature of 106.7, respiratory rate of 37, lactate of 3.99, creatinine of 3.16 and troponin of 15.2. Chest x-ray revealed presence of left lower lobe infiltrate. She was started on IV antibiotic therapy with vancomycin and aztreonam. Cooling blanket was ordered in the emergency department. Her daughter brought in DO NOT RESUSCITATE paperwork. She stated that her mother would not have wanted to undergo invasive or burdensome procedures, including cardiac procedures. She wishes for her mother to be comfortable and also would like for her to be treated with IV antibiotics and IV fluids.                                                                                                                                                                                                                                                                         Review of Systems:  Could not obtain a reliable review of systems  Past Medical History  Diagnosis Date  . DM2 (diabetes mellitus, type 2)   . CAD (coronary artery disease)     cath- 2007  . Alzheimer's dementia   . HLD (hyperlipidemia)   . HTN (hypertension)   .  A-fib   . Breast cancer   . RENAL CALCULUS 01/22/2009    Qualifier: Diagnosis of  By: Burnett Kanaris    . BREAST CANCER 01/22/2009    Qualifier: Diagnosis of  By: Burnett Kanaris    . URINARY INCONTINENCE 04/18/2010    Qualifier: Diagnosis of  By: Earley Favor MD, Cat     Past Surgical History  Procedure Laterality Date  . Replacement total knee    . Abdominal hysterectomy     Social History:  reports that she has never smoked. Her smokeless tobacco use includes Snuff. She reports that she does not drink alcohol. Her drug history is not on file.  Allergies  Allergen Reactions  . Penicillins     REACTION: Reaction not known  . Sulfonamide Derivatives     Family History  Problem Relation Age of Onset  . Breast cancer Sister   . Throat cancer Sister     Prior to Admission medications   Medication Sig Start Date End Date Taking? Authorizing Provider  acetaminophen (TYLENOL) 325 MG tablet Take 650 mg by mouth 2 (two) times daily.   Yes Historical Provider, MD  AMBULATORY NON FORMULARY MEDICATION Morphine Sul Sol 100/93ml Sig: Administer 0.53ml by mouth every 2 hours as needed for pain/shortness of breath. 07-20-2014  Yes Estill Dooms, MD  atenolol (TENORMIN) 25 MG tablet Take 25 mg by mouth daily.     Yes Historical Provider, MD  atropine 1 % ophthalmic solution Place 2 drops into both eyes every 4 (four) hours as needed. For secretions   Yes Historical Provider, MD  azithromycin (ZITHROMAX) 200 MG/5ML suspension Take 250 mg by mouth daily. Unknown start date   Yes Historical Provider, MD  busPIRone (BUSPAR) 5 MG tablet Take 5 mg by mouth  at bedtime.    Yes Historical Provider, MD  furosemide (LASIX) 20 MG tablet Take 20 mg by mouth daily.   Yes Historical Provider, MD  miconazole (MICOTIN) 2 % powder Apply 1 application topically 2 (two) times daily as needed for itching.   Yes Historical Provider, MD  potassium chloride (K-DUR) 10 MEQ tablet Take 10 mEq by mouth daily.   Yes Historical Provider, MD  ranitidine (ZANTAC) 150 MG tablet Take 150 mg by mouth at bedtime.   Yes Historical Provider, MD  sennosides-docusate sodium (SENOKOT-S) 8.6-50 MG tablet Take 2 tablets by mouth daily.   Yes Historical Provider, MD   Physical Exam: Filed Vitals:   07-20-2014 1405 07-20-2014 1406 07/20/2014 1445 Jul 20, 2014 1715  BP: 99/75     Pulse: 151  40   Temp: 103.6 F (39.8 C)   106.7 F (41.5 C)  TempSrc: Axillary   Rectal  Resp: 40  37   SpO2: 100% 95% 96%     Wt Readings from Last 3 Encounters:  07/17/14 88.542 kg (195 lb 3.2 oz)  06/29/14 88.542 kg (195 lb 3.2 oz)  05/30/14 91.173 kg (201 lb)    General:  Patient is ill-appearing, toxic, septic, minimally responsive, not following commands, did open her eyes to was command. Eyes: PERRL, normal lids, irises & conjunctiva ENT: Dry oral mucosa Neck: no LAD, masses or thyromegaly Cardiovascular: Tachycardic, regular rate rhythm, no murmurs rubs or gallops, trace edema to lower extremities Telemetry: SR, no arrhythmias  Respiratory: Patient on 100% nonrebreather, had positive rhonchi to left lung, appears to Abdomen: soft, ntnd Skin: no rash or induration seen on limited exam, left hand hematoma Musculoskeletal: grossly normal tone BUE/BLE Psychiatric: Patient is obtunded, nonverbal, not  following commands Neurologic: Patient obtunded, cannot perform adequate neurologic examination as patient was unable to follow commands.           Labs on Admission:  Basic Metabolic Panel:  Recent Labs Lab 2014-07-31 1423  NA 145  K 5.9*  CL 108  CO2 21  GLUCOSE 374*  BUN 45*  CREATININE  3.16*  CALCIUM 10.7*   Liver Function Tests:  Recent Labs Lab 31-Jul-2014 1423  AST 63*  ALT 20  ALKPHOS 73  BILITOT 1.6*  PROT 7.1  ALBUMIN 3.3*   No results for input(s): LIPASE, AMYLASE in the last 168 hours. No results for input(s): AMMONIA in the last 168 hours. CBC:  Recent Labs Lab 07/31/2014 1423  WBC 12.8*  NEUTROABS 11.3*  HGB 18.5*  HCT 56.3*  MCV 95.9  PLT 189   Cardiac Enzymes: No results for input(s): CKTOTAL, CKMB, CKMBINDEX, TROPONINI in the last 168 hours.  BNP (last 3 results) No results for input(s): PROBNP in the last 8760 hours. CBG: No results for input(s): GLUCAP in the last 168 hours.  Radiological Exams on Admission: Dg Chest Port 1 View  2014-07-31   CLINICAL DATA:  Difficulty breathing and fever for 2 days  EXAM: PORTABLE CHEST - 1 VIEW  COMPARISON:  July 12, 2012  FINDINGS: There is left lower lobe airspace consolidation with a minimal left effusion. Lungs elsewhere clear. Heart is mildly enlarged with pulmonary vascularity within normal limits. No adenopathy. There is atherosclerotic change in the aorta. There is an old lateral right sixth rib fracture, stable.  IMPRESSION: Left lower lobe consolidation with minimal left effusion. Lungs otherwise clear. Heart prominent but stable.   Electronically Signed   By: Lowella Grip M.D.   On: 2014/07/31 15:06    EKG: Independently reviewed.   Assessment/Plan Principal Problem:   Sepsis Active Problems:   AKI (acute kidney injury)   Dementia without behavioral disturbance   Essential hypertension, benign   Diabetes mellitus type II, controlled, with no complications   Acute encephalopathy   Metabolic acidosis   Hyperkalemia   1. Sepsis, present on admission, evidenced by temperature 106.7, heart rate of 132, respiratory rate of 37, lactic acid of 3.99 with source of infection likely healthcare associated pneumonia. Chest x-ray performed in the emergency room showed the presence of a left  lower lobe infiltrate. Will provide IV fluid resuscitation, start her on broad-spectrum IV antimicrobial therapy with cefepime and vancomycin, follow-up on blood cultures and sputum cultures, admit patient to step down unit. Given temp of 106.7 will require cooling blankets which has been ordered. Repeat chest x-ray in a.m.  2. Non-ST segment elevation myocardial infarction. Patient presenting critically ill, septic, having troponin of 15.21. Her daughter who is healthcare power of attorney expresses wishes that she does not undergo cardiac procedures. She probably has demand component. There was no report of chest pain and was unable to tell us if she had chest pain on admission. EKG showed RBBB that was present on prior EKS, tachycardic, no ST segment changes. Spoke with cardiology, we feel that it would not be unreasonable to hold anticoagulation, given patient's previous stated wishes.  3. Acute kidney injury. Tachycardia secondary to critical illness as well as severe hypovolemia. Patient having a functional decline over the past week with associated minimal by mouth intake. She was bolused with normal saline in the emergency department, will continue IV fluid resuscitation overnight.  4. Acute encephalopathy. PICA secondary to toxic encephalopathy in setting of sepsis, profound  dehydration, fever. Family members reporting patient having a functional decline over the past 10 days likely related to developing pneumonia. 5. Hyperkalemia. Labs showing potassium of 5.9, will give 10 units of insulin.  6. Type 2 diabetes mellitus. Patient presenting with blood sugars in the 300 range. Will perform Accu-Cheks every 4 hours with sliding scale coverage 7. History of hypertension. Hold antihypertensive agents 8. Goals of care. I discussed goals of care with her daughter in the emergency department. Daughter reports that Mrs Degan has stated in the past that she would not want to undergo invasive or burdensome  procedures, including cardiac procedures, nor would she have wanted to be placed on life support or undergo CPR. Patient is a DO NOT RESUSCITATE. Family wishes for patient to be admitted to the hospital, given supportive care, IV fluids, IV antimicrobial therapy.     Code Status: DO NOT RESUSCITATE Family Communication: I spoke with her daughter who is present at bedside Disposition Plan: Anticipate patient will require greater than 2 nights hospitalization  Time spent: 58 minutes  Kelvin Cellar [facility] Pager [phone]

## 2014-07-24 NOTE — Telephone Encounter (Signed)
[facility] 

## 2014-07-24 NOTE — ED Notes (Addendum)
Dr. Coralyn Pear at bedside prior to pt transport and aware of pt BP- verbal order to start 1000cc bolus of NS.  NS bolus hung just prior to pt transport to floor.

## 2014-07-24 NOTE — ED Notes (Signed)
Daughter and Dr. Maryan Rued at the bedside discussing plan of care for pt.

## 2014-07-24 NOTE — ED Notes (Signed)
Troponin results given to Dr. Maryan Rued

## 2014-07-24 NOTE — ED Notes (Signed)
Admitting MD notified of rectal temp of 106.7. Cooling blanket applied per MD orders.

## 2014-07-24 NOTE — ED Notes (Addendum)
Spoke with daughter, Laural Golden [phone], and updated on POC.

## 2014-07-24 NOTE — Progress Notes (Addendum)
ANTIBIOTIC CONSULT NOTE - INITIAL  Pharmacy Consult for Vancomycin & Zosyn Indication: pneumonia  Allergies  Allergen Reactions  . Penicillins     REACTION: Reaction not known  . Sulfonamide Derivatives     Patient Measurements:   Vital Signs: Temp: 103.6 F (39.8 C) (01/26 1405) Temp Source: Axillary (01/26 1405) BP: 99/75 mmHg (01/26 1405) Pulse Rate: 40 (01/26 1445) Intake/Output from previous day:   Intake/Output from this shift: Total I/O In: -  Out: 14 [Urine:14]  Labs:  Recent Labs  08/04/2014 1423  WBC 12.8*  HGB 18.5*  PLT 189   CrCl cannot be calculated (Patient has no serum creatinine result on file.). No results for input(s): VANCOTROUGH, VANCOPEAK, VANCORANDOM, GENTTROUGH, GENTPEAK, GENTRANDOM, TOBRATROUGH, TOBRAPEAK, TOBRARND, AMIKACINPEAK, AMIKACINTROU, AMIKACIN in the last 72 hours.   Microbiology: No results found for this or any previous visit (from the past 720 hour(s)).  Medical History: Past Medical History  Diagnosis Date  . DM2 (diabetes mellitus, type 2)   . CAD (coronary artery disease)     cath- 2007  . Alzheimer's dementia   . HLD (hyperlipidemia)   . HTN (hypertension)   . A-fib   . Breast cancer   . RENAL CALCULUS 01/22/2009    Qualifier: Diagnosis of  By: Burnett Kanaris    . BREAST CANCER 01/22/2009    Qualifier: Diagnosis of  By: Burnett Kanaris    . URINARY INCONTINENCE 04/18/2010    Qualifier: Diagnosis of  By: Ta MD, Cat      Medications:   (Not in a hospital admission) Scheduled:   Infusions:  . vancomycin     Assessment:  89 YOF from [facility] w/ PMH HTN, GERD, DM2, dementia, edema, anxiety, chronic pain, constipation presenting to [facility] on 2014-08-04 c/o lethargy.  Patient evaluated at [facility] place and received Rocephin 1 g IM x 1 with Z-pack x 5 days.  Pharmacy has been consulted to dose vancomycin and zosyn for possible PNA/sepsis.    LA 3.99, WBC up 12.8, febrile Tmax 103.6, HR brady 40, RR up 37.    Scr  3.16, CrCl 15-20 mL UA LE neg., nitrite neg, few bacteria  Spoke to patient's sister, unknown reaction to PCN.  Spoke to MD, will change to aztreonam.    Goal of Therapy:  Vancomycin trough level 15-20 mcg/ml  Plan:  - Vancomycin 1500 mg IV LD x 1 - Vancomycin 1000 mg IV Q48H - Aztreonam 1 g IV Q8H - Monitor clinical efficacy and trough levels when appropriate - Monitor renal function - Follow up cultures  Hassie Bruce, Pharm. D. Clinical Pharmacy Resident Pager: [phone] Ph: [phone] 2014-08-04 3:41 PM  ===================  Per MD to switch to cefepime 1 g Q24H.  I am okay with this switch given there is a very low chance of cross reactivity with penicillins and cephalosporins along with no recent documentation of anaphylaxis to a penicillin.  Will monitor patient closely for any allergic reaction.  Hassie Bruce, Pharm. D. Clinical Pharmacy Resident Pager: [phone] Ph: [phone] 08/04/2014 6:28 PM

## 2014-07-24 NOTE — Progress Notes (Addendum)
Patient expired at 1925, shortly after arrival to unit. Respiratory effort ceased and no breath sounds auscultated, telemetry reading asystole, no pulses palpated.  Time of death called by Reatha Armour RN and Nani Ravens with the attending's approval.

## 2014-07-24 NOTE — ED Notes (Signed)
Admitting MD at bedside.

## 2014-07-24 NOTE — ED Notes (Signed)
Per EMS: Pt from [facility]. Per staff, pt has been unresponsive since yesterday afternoon. Staff reports temp of 103 and increased RR. On EMS arrival, pt remains unresponsive. Pt noted to be in Vtach, rate 80-188. BP 88/61. CBG 238. Pt is DNR/DNI. EMS spoke with daughter in [place], Laural Golden who request comfort care.

## 2014-07-24 NOTE — ED Notes (Signed)
CBG 250 

## 2014-07-24 DEATH — deceased

## 2014-07-29 NOTE — Discharge Summary (Signed)
Death Summary  DIANEY Rodriguez YOM:[id number] DOB: [birth year] DOA: 08/03/14  PCP: Blanchie Serve, MD PCP/Office notified:   Admit date: 03-Aug-2014 Date of Death: August 14, 2014  Final Diagnoses:  Principal Problem:   Sepsis Active Problems:   AKI (acute kidney injury)   Dementia without behavioral disturbance   Essential hypertension, benign   Diabetes mellitus type II, controlled, with no complications   Acute encephalopathy   Metabolic acidosis   Hyperkalemia      History of present illness:  Debbie Rodriguez is a 81 y.o. female with a past medical history of cognitive impairment, hypertension, type 2 diabetes mellitus currently nursing home patient, who was transferred to the emergency department today for mental status changes. History was obtained from her daughter was present at bedside and emergency room staff. Patient is obtunded and unable to provide history or participate in her own plan of care. Her daughter reported that patient had a decline in function approximate 10 days ago, becoming progressively weaker, less interactive, having minimal by mouth intake, and lethargic. During this time she did not have cough, shortness of breath, or sputum production. She had a steep decline as she was found to be minimally responsive by nursing staff last Sunday as her daughter reports oral antibiotics were prescribed. She fail to improve remaining lethargic, not taking by mouth intake. In the emergency room she was found to be septic having a rectal temperature of 106.7, respiratory rate of 37, lactate of 3.99, creatinine of 3.16 and troponin of 15.2. Chest x-ray revealed presence of left lower lobe infiltrate. She was started on IV antibiotic therapy with vancomycin and aztreonam. Cooling blanket was ordered in the emergency department. Her daughter brought in DO NOT RESUSCITATE paperwork. She stated that her mother would not have wanted to undergo invasive or burdensome procedures, including  cardiac procedures. She wishes for her mother to be comfortable and also would like for her to be treated with IV antibiotics and IV fluids.  Hospital Course:  Patient was transferred to the step-down unit from the emergency room at 19:00. She expired at 19:25 shortly after arrival to the unit. Per RN there were no breath sounds, tele showed asystole, no pulses palpated. Family members were present. I was not present at the time of death. Cause of death Sepsis likely secondary to Pneumonia.      SignedKelvin Cellar  [facility] Aug 14, 2014, 3:59 PM

## 2016-04-26 IMAGING — CR DG CHEST 1V PORT
1 series · 1 of 1 positions shown · non-contrast
Comparison: July 12, 2012

CLINICAL DATA: Difficulty breathing and fever for 2 days

EXAM:
PORTABLE CHEST - 1 VIEW

[AP]
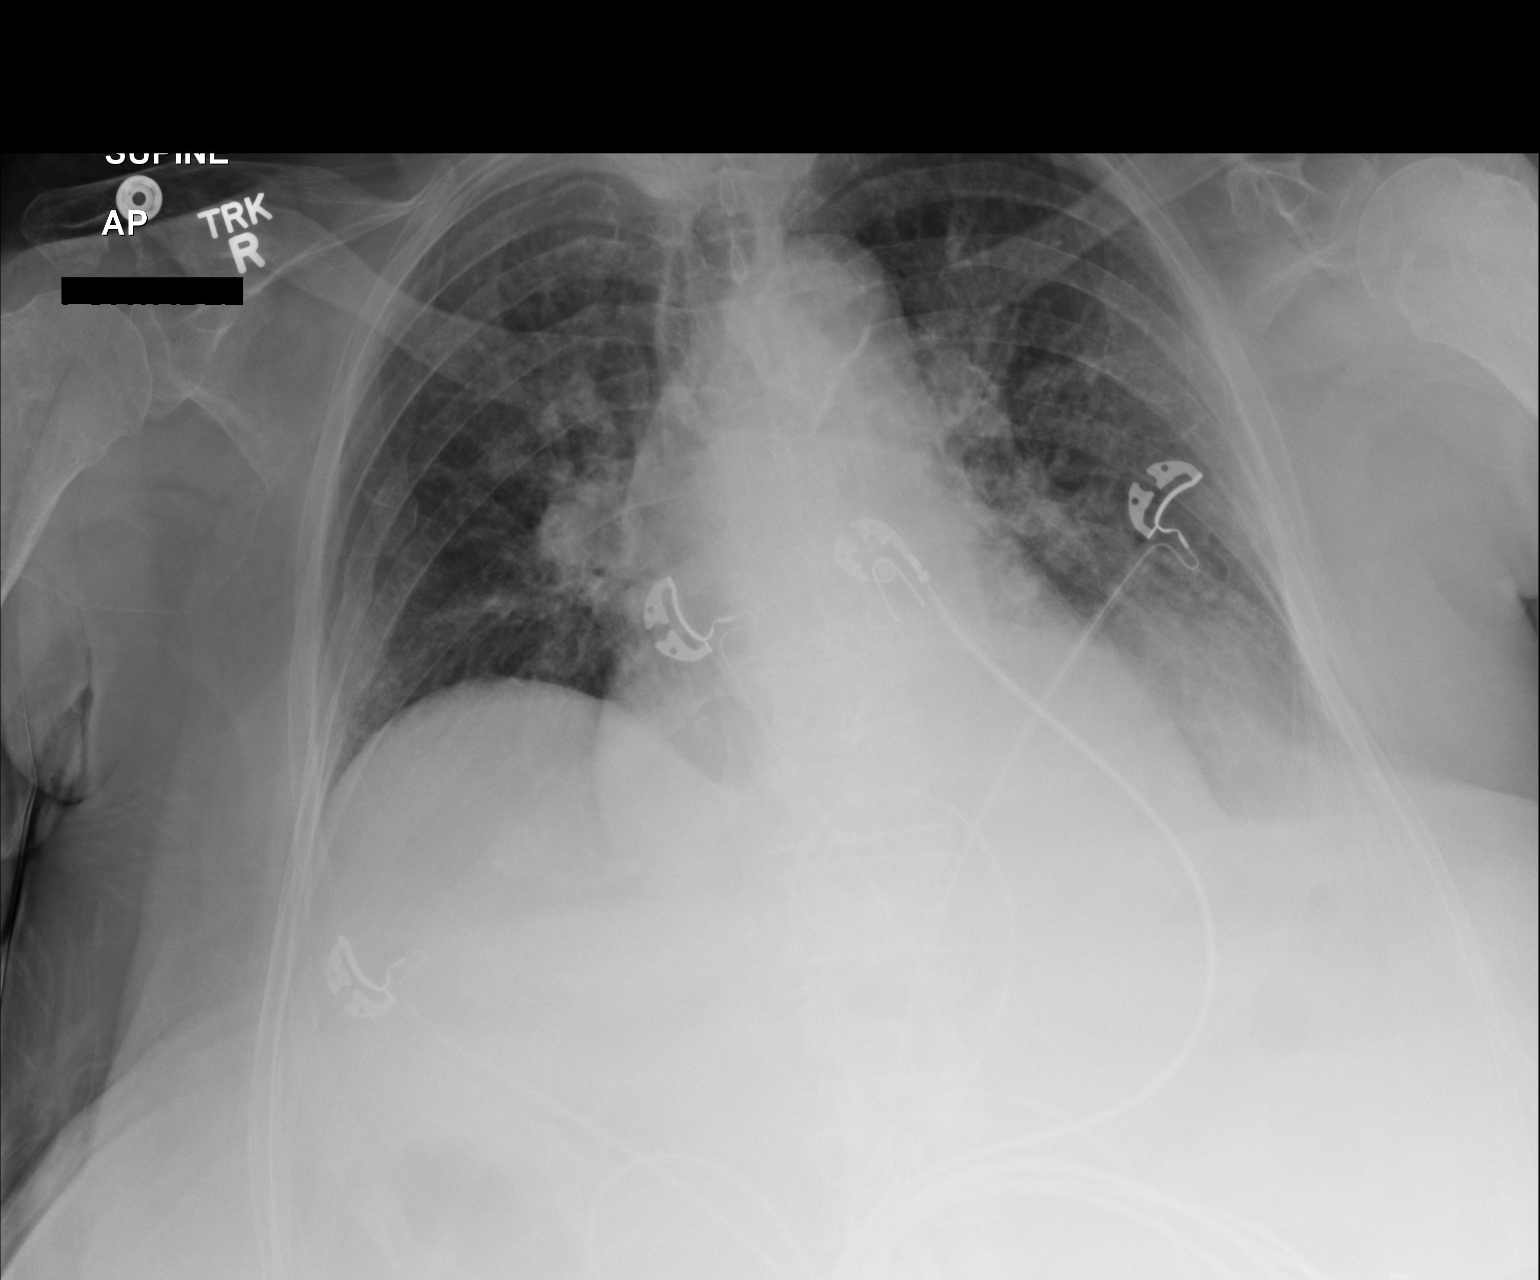

[1 of 1 positions shown; findings below may reference images not displayed]

FINDINGS: There is left lower lobe airspace consolidation with a minimal left
effusion. Lungs elsewhere clear. Heart is mildly enlarged with
pulmonary vascularity within normal limits. No adenopathy. There is
atherosclerotic change in the aorta. There is an old lateral right
sixth rib fracture, stable.
IMPRESSION: Left lower lobe consolidation with minimal left effusion. Lungs
otherwise clear. Heart prominent but stable.
